# Patient Record
Sex: Male | Born: 1970 | Race: White | Hispanic: No | Marital: Married | Smoking: Former smoker
Health system: Southern US, Community
[De-identification: ages and names within clinical notes are randomized; demographics above are authoritative.]

## PROBLEM LIST (undated history)

## (undated) DIAGNOSIS — F32A Depression, unspecified: Secondary | ICD-10-CM

## (undated) DIAGNOSIS — F419 Anxiety disorder, unspecified: Secondary | ICD-10-CM

## (undated) DIAGNOSIS — F329 Major depressive disorder, single episode, unspecified: Secondary | ICD-10-CM

## (undated) DIAGNOSIS — K219 Gastro-esophageal reflux disease without esophagitis: Secondary | ICD-10-CM

## (undated) DIAGNOSIS — B019 Varicella without complication: Secondary | ICD-10-CM

## (undated) HISTORY — PX: NASAL POLYP EXCISION: SHX2068

## (undated) HISTORY — PX: VASECTOMY: SHX75

---

## 1999-05-24 ENCOUNTER — Encounter: Payer: Self-pay | Admitting: Emergency Medicine

## 1999-05-24 ENCOUNTER — Emergency Department (HOSPITAL_COMMUNITY): Admission: EM | Admit: 1999-05-24 | Discharge: 1999-05-24 | Payer: Self-pay | Admitting: Emergency Medicine

## 2006-12-13 ENCOUNTER — Emergency Department (HOSPITAL_COMMUNITY): Admission: EM | Admit: 2006-12-13 | Discharge: 2006-12-13 | Payer: Self-pay | Admitting: Emergency Medicine

## 2007-02-20 ENCOUNTER — Encounter: Admission: RE | Admit: 2007-02-20 | Discharge: 2007-02-20 | Payer: Self-pay | Admitting: Family Medicine

## 2007-02-22 ENCOUNTER — Encounter: Admission: RE | Admit: 2007-02-22 | Discharge: 2007-02-22 | Payer: Self-pay | Admitting: Family Medicine

## 2011-01-11 NOTE — Consult Note (Signed)
NAMEJAIYDEN, LAUR              ACCOUNT NO.:  [id number]   MEDICAL RECORD NO.:  [id number]          PATIENT TYPE:  EMS   LOCATION:  [facility]                         FACILITY:  [facility]   PHYSICIAN:  Artist Pais. Weingold, M.D.DATE OF BIRTH:  1971-06-30   DATE OF CONSULTATION:  12/13/2006  DATE OF DISCHARGE:  12/13/2006                                 CONSULTATION   EMERGENCY ROOM CONSULTATION:   PHYSICIAN REQUESTING CONSULTATION:  Is Dr. Weldon Inches.   REASON FOR CONSULTATION:  Mr. Byrnes is a 40 year old right hand  dominant male who was bit by his own cat on left hand over the index  finger.  Presents today with pain and swelling status post cat bit 12  hours ago.  He is 89 years old.   ALLERGIES:  HE HAS NO KNOWN DRUG ALLERGIES.   MEDICATIONS:  No current medications.   HOSPITALIZATIONS OR SURGERIES:  None.   FAMILY HISTORY:  Noncontributory.   SOCIAL HISTORY:  Occasional alcohol use.  He quit smoking a year ago.  He had been actively smoking for about 20 years.   PHYSICAL EXAMINATION:  GENERAL:  Pleasant, alerted and oriented x3.  EXTREMITIES:  He has a puncture wound on the dorsal aspect 2 cm proximal  to the MCP joint, one volarly just distal to the MCP joint.  He can flex  and extend.  He has negative Kanavel signs.  He has some slight swelling  dorsally and palmar over the puncture wounds.  He has no streak up the  arm, no adenopathy.  X-rays are pending.  He has not complained of  fever, chills, et Karie Soda.  Again, no signs of flexor sheath involvement  and his MCP joint is nontender to compression testing and he is able to  make a composite testing. Marland Kitchen   IMPRESSION:  A 40 year old male status post cat bit 12 hours ago with  pain and swelling.   RECOMMENDATIONS:  At this point in time is to receive an IV dose of  Unison 3 g.  He will be discharged with Augmentin 875 one p.o. b.i.d.  He is to followup in my office on Tuesday for a followup.  He is to soak  it in warm  soapy water three times a day and if he has any problems over  the weekend he is to call the office number for followup.     Artist Pais Mina Marble, M.D.  Electronically Signed    MAW/MEDQ  D:  12/13/2006  T:  12/13/2006  Job:  [phone]

## 2013-05-20 ENCOUNTER — Emergency Department (HOSPITAL_COMMUNITY): Payer: BC Managed Care – PPO

## 2013-05-20 ENCOUNTER — Encounter (HOSPITAL_COMMUNITY): Payer: Self-pay | Admitting: *Deleted

## 2013-05-20 ENCOUNTER — Emergency Department (HOSPITAL_COMMUNITY)
Admission: EM | Admit: 2013-05-20 | Discharge: 2013-05-21 | Disposition: A | Discharge status: Home / Self Care | Payer: BC Managed Care – PPO | Attending: Emergency Medicine | Admitting: Emergency Medicine

## 2013-05-20 ENCOUNTER — Other Ambulatory Visit (HOSPITAL_COMMUNITY): Payer: Self-pay | Admitting: Internal Medicine

## 2013-05-20 DIAGNOSIS — R110 Nausea: Secondary | ICD-10-CM

## 2013-05-20 DIAGNOSIS — R112 Nausea with vomiting, unspecified: Secondary | ICD-10-CM | POA: Insufficient documentation

## 2013-05-20 DIAGNOSIS — R109 Unspecified abdominal pain: Secondary | ICD-10-CM | POA: Diagnosis present

## 2013-05-20 LAB — CBC WITH DIFFERENTIAL/PLATELET
Basophils Absolute: 0 10*3/uL (ref 0.0–0.1)
Basophils Relative: 1 % (ref 0–1)
Eosinophils Absolute: 0.1 10*3/uL (ref 0.0–0.7)
HCT: 39.6 % (ref 39.0–52.0)
Hemoglobin: 14.7 g/dL (ref 13.0–17.0)
Lymphocytes Relative: 50 % — ABNORMAL HIGH (ref 12–46)
Monocytes Relative: 12 % (ref 3–12)
Neutro Abs: 1.4 10*3/uL — ABNORMAL LOW (ref 1.7–7.7)
Neutrophils Relative %: 34 % — ABNORMAL LOW (ref 43–77)
Platelets: 205 10*3/uL (ref 150–400)
RDW: 12.3 % (ref 11.5–15.5)
WBC: 4.1 10*3/uL (ref 4.0–10.5)

## 2013-05-20 LAB — COMPREHENSIVE METABOLIC PANEL
ALT: 18 U/L (ref 0–53)
AST: 17 U/L (ref 0–37)
Albumin: 4 g/dL (ref 3.5–5.2)
CO2: 28 mEq/L (ref 19–32)
Chloride: 102 mEq/L (ref 96–112)
Creatinine, Ser: 0.94 mg/dL (ref 0.50–1.35)
GFR calc non Af Amer: >90 mL/min (ref >90)
Glucose, Bld: 93 mg/dL (ref 70–99)
Potassium: 4.4 mEq/L (ref 3.5–5.1)
Sodium: 141 mEq/L (ref 135–145)
Total Bilirubin: 0.7 mg/dL (ref 0.3–1.2)

## 2013-05-20 LAB — URINALYSIS, ROUTINE W REFLEX MICROSCOPIC
Glucose, UA: NEGATIVE mg/dL
Hgb urine dipstick: NEGATIVE
Ketones, ur: NEGATIVE mg/dL
Leukocytes, UA: NEGATIVE
Protein, ur: NEGATIVE mg/dL
pH: 6 (ref 5.0–8.0)

## 2013-05-20 LAB — LIPASE, BLOOD: Lipase: 18 U/L (ref 11–59)

## 2013-05-20 MED ORDER — IOHEXOL 300 MG/ML  SOLN
25.0000 mL | Freq: Once | INTRAMUSCULAR | Status: AC | PRN
  Administered 2013-05-20: 25 mL via ORAL

## 2013-05-20 MED ORDER — ONDANSETRON HCL 4 MG/2ML IJ SOLN: 4.0000 mg | Freq: Once | INTRAMUSCULAR | Status: DC

## 2013-05-20 MED ORDER — IOHEXOL 300 MG/ML  SOLN
100.0000 mL | Freq: Once | INTRAMUSCULAR | Status: AC | PRN
  Administered 2013-05-20: 100 mL via INTRAVENOUS

## 2013-05-20 MED ORDER — METOCLOPRAMIDE HCL 5 MG/ML IJ SOLN
10.0000 mg | Freq: Once | INTRAMUSCULAR | Status: AC
  Administered 2013-05-20: 10 mg via INTRAVENOUS
  Filled 2013-05-20: qty 2

## 2013-05-20 MED ORDER — SODIUM CHLORIDE 0.9 % IV BOLUS (SEPSIS)
1000.0000 mL | INTRAVENOUS | Status: AC
  Administered 2013-05-20: 1000 mL via INTRAVENOUS

## 2013-05-20 NOTE — ED Provider Notes (Signed)
CSN: [id number]     Arrival date & time 05/20/13  1820 History   First MD Initiated Contact with Patient 05/20/13 1838     Chief Complaint  Patient presents with  . Abdominal Pain  . Nausea   (Consider location/radiation/quality/duration/timing/severity/associated sxs/prior Treatment) Patient is a 42 y.o. male presenting with abdominal pain. The history is provided by the patient.  Abdominal Pain Pain location:  Epigastric and RUQ Pain quality: sharp   Pain radiates to:  Does not radiate Pain severity:  Moderate Onset quality:  Gradual Duration:  9 days Timing:  Intermittent Progression:  Worsening Chronicity:  New Context: not previous surgeries   Relieved by:  Nothing Worsened by:  Nothing tried Ineffective treatments:  None tried Associated symptoms: nausea and vomiting   Associated symptoms: no chest pain, no cough, no diarrhea, no dysuria, no fever, no hematuria and no shortness of breath     History reviewed. No pertinent past medical history. History reviewed. No pertinent past surgical history. History reviewed. No pertinent family history. History  Substance Use Topics  . Smoking status: Never Smoker   . Smokeless tobacco: Not on file  . Alcohol Use: Yes     Comment: occ    Review of Systems  Constitutional: Negative for fever.  HENT: Negative for rhinorrhea, drooling and neck pain.   Eyes: Negative for pain.  Respiratory: Negative for cough and shortness of breath.   Cardiovascular: Negative for chest pain and leg swelling.  Gastrointestinal: Positive for nausea, vomiting and abdominal pain. Negative for diarrhea.  Genitourinary: Negative for dysuria and hematuria.  Musculoskeletal: Negative for gait problem.  Skin: Negative for color change.  Neurological: Negative for numbness and headaches.  Hematological: Negative for adenopathy.  Psychiatric/Behavioral: Negative for behavioral problems.  All other systems reviewed and are negative.    Allergies   Review of patient's allergies indicates no known allergies.  Home Medications  No current outpatient prescriptions on file. BP 137/86  Pulse 61  Temp(Src) 97.8 F (36.6 C) (Oral)  Resp 18  Ht 5\' 10"  (1.778 m)  Wt 165 lb (74.844 kg)  BMI 23.68 kg/m2  SpO2 99% Physical Exam  Nursing note and vitals reviewed. Constitutional: He is oriented to person, place, and time. He appears well-developed and well-nourished.  HENT:  Head: Normocephalic and atraumatic.  Right Ear: External ear normal.  Left Ear: External ear normal.  Nose: Nose normal.  Mouth/Throat: Oropharynx is clear and moist. No oropharyngeal exudate.  Eyes: Conjunctivae and EOM are normal. Pupils are equal, round, and reactive to light.  Neck: Normal range of motion. Neck supple.  Cardiovascular: Normal rate, regular rhythm, normal heart sounds and intact distal pulses.  Exam reveals no gallop and no friction rub.   No murmur heard. Pulmonary/Chest: Effort normal and breath sounds normal. No respiratory distress. He has no wheezes.  Abdominal: Soft. Bowel sounds are normal. He exhibits no distension. There is no tenderness. There is no rebound and no guarding.  Musculoskeletal: Normal range of motion. He exhibits no edema and no tenderness.  Neurological: He is alert and oriented to person, place, and time.  Skin: Skin is warm and dry.  Psychiatric: He has a normal mood and affect. His behavior is normal.    ED Course  Procedures (including critical care time) Labs Review Labs Reviewed  CBC WITH DIFFERENTIAL - Abnormal; Notable for the following:    MCH 34.3 (*)    MCHC 37.3 (*)    Neutrophils Relative % 34 (*)  Neutro Abs 1.4 (*)    Lymphocytes Relative 50 (*)    All other components within normal limits  COMPREHENSIVE METABOLIC PANEL  LIPASE, BLOOD  URINALYSIS, ROUTINE W REFLEX MICROSCOPIC   Imaging Review Ct Abdomen Pelvis W Contrast  05/21/2013   CLINICAL DATA:  Abdominal pain, nausea and diarrhea.   EXAM: CT ABDOMEN AND PELVIS WITH CONTRAST  TECHNIQUE: Multidetector CT imaging of the abdomen and pelvis was performed using the standard protocol following bolus administration of intravenous contrast.  CONTRAST:  OMNIPAQUE IOHEXOL 300 MG/ML  SOLN  COMPARISON:  None.  FINDINGS: There is some dependent atelectasis in the lung bases. No pleural or pericardial effusion.  The gallbladder, liver, spleen, adrenal glands, left kidney and pancreas all appear normal. Tiny low attenuating lesion in the lower pole of the right kidney is compatible with a cyst. The right kidney is otherwise unremarkable.  The stomach, small and large bowel and appendix appear normal. No fluid is identified. The urinary bladder is decompressed but otherwise unremarkable. No lymphadenopathy. There is some scattered atherosclerosis of the aorta.  IMPRESSION: No acute finding. No finding to explain the patient's symptoms. Mild atherosclerosis noted.   Electronically Signed   By: Drusilla Kanner M.D.   On: 05/21/2013 00:39   US Abdomen Limited Ruq  05/20/2013   *RADIOLOGY REPORT*  Clinical Data:  Right upper quadrant abdominal pain.  ABDOMINAL ULTRASOUND COMPLETE  Comparison:  None.  Findings:  Gallbladder:  No gallstones, gallbladder wall thickening, or pericholecystic fluid.  Common Bile Duct:  Measures 3.6 mm which is within normal limits in caliber.  Liver: No focal mass lesion identified.  Within normal limits in parenchymal echogenicity.  IVC:  Appears normal.  Pancreas:  No abnormality identified.  Spleen:  Within normal limits in size and echotexture.  Right kidney:  Measures 12.6 cm in length. Normal in size and parenchymal echogenicity.  No evidence of mass or hydronephrosis.  Left kidney:  Measures 11.9 cm in length. Normal in size and parenchymal echogenicity.  No evidence of mass or hydronephrosis.  Abdominal Aorta:  No aneurysm identified.  IMPRESSION: Negative abdominal ultrasound.   Original Report Authenticated By: Lupita Raider.,  M.D.    MDM   1. Abdominal pain   2. Nausea    7:00 PM 42 y.o. male who presents with nausea and upper abdominal pain for approximately 9 days. The patient denies any fevers or diarrhea. He notes that his pain began approximately 30 minutes after eating. Usually does not have pain unless he eats. He is afebrile and vital signs are unremarkable here. He is not having any pain on exam. Will get nausea control, IV fluids, labs, ultrasound to evaluate his gallbladder.  12:55 AM: I interpreted/reviewed the labs and/or imaging which were non-contributory.  Neg CT and [place] of gb. Will start on sx tx, pain meds, ppi, nausea meds.  Unsure of the etiology of his pain but it appears to be related to eating. Possibly gi/duod ulcer vs gb dysfunction.   I have discussed the diagnosis/risks/treatment options with the patient and family and believe the pt to be eligible for discharge home to follow-up with his pcp tomorrow. We also discussed returning to the ED immediately if new or worsening sx occur. We discussed the sx which are most concerning (e.g., fever, worsening pain) that necessitate immediate return. Any new prescriptions provided to the patient are listed below.  Discharge Medication List as of 05/21/2013 12:57 AM    START taking these medications  Details  oxyCODONE-acetaminophen (PERCOCET) 5-325 MG per tablet Take 1 tablet by mouth every 6 (six) hours as needed for pain., Starting 05/21/2013, Until Discontinued, Print    pantoprazole (PROTONIX) 20 MG tablet Take 1 tablet (20 mg total) by mouth daily., Starting 05/21/2013, Until Discontinued, Print    promethazine (PHENERGAN) 25 MG tablet Take 1 tablet (25 mg total) by mouth every 8 (eight) hours as needed for nausea., Starting 05/21/2013, Until Discontinued, Print         Junius Argyle, MD 05/21/13 1455

## 2013-05-20 NOTE — ED Notes (Signed)
Pt reports generalized abd pain with nausea and diarrhea. Has been to pcp and given zofran with no relief.

## 2013-05-21 ENCOUNTER — Ambulatory Visit (HOSPITAL_COMMUNITY): Payer: BC Managed Care – PPO

## 2013-05-21 ENCOUNTER — Encounter (HOSPITAL_COMMUNITY): Payer: Self-pay | Admitting: Radiology

## 2013-05-21 DIAGNOSIS — R110 Nausea: Secondary | ICD-10-CM | POA: Diagnosis present

## 2013-05-21 DIAGNOSIS — R109 Unspecified abdominal pain: Secondary | ICD-10-CM | POA: Diagnosis present

## 2013-05-21 MED ORDER — OXYCODONE-ACETAMINOPHEN 5-325 MG PO TABS: 1.0000 | ORAL_TABLET | Freq: Four times a day (QID) | ORAL | Status: DC | PRN

## 2013-05-21 MED ORDER — PANTOPRAZOLE SODIUM 20 MG PO TBEC: 20.0000 mg | DELAYED_RELEASE_TABLET | Freq: Every day | ORAL | Status: DC

## 2013-05-21 MED ORDER — PROMETHAZINE HCL 25 MG PO TABS: 25.0000 mg | ORAL_TABLET | Freq: Three times a day (TID) | ORAL | Status: DC | PRN

## 2015-11-16 ENCOUNTER — Telehealth: Payer: Self-pay | Admitting: Internal Medicine

## 2015-11-16 NOTE — Telephone Encounter (Signed)
Received records from [facility] for appointment on 11/30/15 with Dr Hilty.  Records given to N Hines (medical records) for Dr Hilty's schedule on 11/30/15. lp °

## 2015-11-30 ENCOUNTER — Ambulatory Visit: Payer: Self-pay | Admitting: Internal Medicine

## 2016-03-05 ENCOUNTER — Other Ambulatory Visit: Payer: Self-pay | Admitting: Gastroenterology

## 2016-03-05 DIAGNOSIS — R1011 Right upper quadrant pain: Secondary | ICD-10-CM

## 2016-03-20 ENCOUNTER — Ambulatory Visit (HOSPITAL_COMMUNITY)
Admission: RE | Admit: 2016-03-20 | Discharge: 2016-03-20 | Disposition: A | Discharge status: Home / Self Care | Payer: BLUE CROSS/BLUE SHIELD | Source: Ambulatory Visit | Attending: Gastroenterology | Admitting: Gastroenterology

## 2016-03-20 DIAGNOSIS — R1011 Right upper quadrant pain: Secondary | ICD-10-CM | POA: Insufficient documentation

## 2016-03-20 MED ORDER — TECHNETIUM TC 99M MEBROFENIN IV KIT
5.5000 | PACK | Freq: Once | INTRAVENOUS | Status: AC | PRN
  Administered 2016-03-20: 5.5 via INTRAVENOUS

## 2016-04-04 ENCOUNTER — Other Ambulatory Visit: Payer: Self-pay | Admitting: Gastroenterology

## 2016-04-04 DIAGNOSIS — R110 Nausea: Secondary | ICD-10-CM

## 2016-04-04 DIAGNOSIS — R1084 Generalized abdominal pain: Secondary | ICD-10-CM

## 2016-04-10 ENCOUNTER — Encounter (HOSPITAL_COMMUNITY)
Admission: RE | Admit: 2016-04-10 | Discharge: 2016-04-10 | Disposition: A | Discharge status: Home / Self Care | Payer: BLUE CROSS/BLUE SHIELD | Source: Ambulatory Visit | Attending: Gastroenterology | Admitting: Gastroenterology

## 2016-04-10 DIAGNOSIS — R110 Nausea: Secondary | ICD-10-CM | POA: Insufficient documentation

## 2016-04-10 MED ORDER — TECHNETIUM TC 99M SULFUR COLLOID
2.0000 | Freq: Once | INTRAVENOUS | Status: AC | PRN
  Administered 2016-04-10: 2 via ORAL

## 2016-06-24 ENCOUNTER — Other Ambulatory Visit: Payer: Self-pay | Admitting: Surgery

## 2016-07-10 ENCOUNTER — Encounter (HOSPITAL_COMMUNITY): Payer: Self-pay | Admitting: Anesthesiology

## 2016-07-10 ENCOUNTER — Encounter (HOSPITAL_COMMUNITY): Payer: Self-pay

## 2016-07-10 ENCOUNTER — Encounter (HOSPITAL_COMMUNITY)
Admission: RE | Admit: 2016-07-10 | Discharge: 2016-07-10 | Disposition: A | Discharge status: Home / Self Care | Payer: Managed Care, Other (non HMO) | Source: Ambulatory Visit | Attending: Surgery | Admitting: Surgery

## 2016-07-10 DIAGNOSIS — Z79899 Other long term (current) drug therapy: Secondary | ICD-10-CM | POA: Diagnosis not present

## 2016-07-10 DIAGNOSIS — Z87891 Personal history of nicotine dependence: Secondary | ICD-10-CM | POA: Diagnosis not present

## 2016-07-10 DIAGNOSIS — K219 Gastro-esophageal reflux disease without esophagitis: Secondary | ICD-10-CM | POA: Diagnosis not present

## 2016-07-10 DIAGNOSIS — F419 Anxiety disorder, unspecified: Secondary | ICD-10-CM | POA: Diagnosis not present

## 2016-07-10 DIAGNOSIS — Z7951 Long term (current) use of inhaled steroids: Secondary | ICD-10-CM | POA: Diagnosis not present

## 2016-07-10 DIAGNOSIS — K811 Chronic cholecystitis: Secondary | ICD-10-CM | POA: Diagnosis not present

## 2016-07-10 HISTORY — DX: Gastro-esophageal reflux disease without esophagitis: K21.9

## 2016-07-10 HISTORY — DX: Major depressive disorder, single episode, unspecified: F32.9

## 2016-07-10 HISTORY — DX: Depression, unspecified: F32.A

## 2016-07-10 HISTORY — DX: Anxiety disorder, unspecified: F41.9

## 2016-07-10 LAB — CBC
HCT: 41.7 % (ref 39.0–52.0)
HEMOGLOBIN: 15 g/dL (ref 13.0–17.0)
MCH: 33.6 pg (ref 26.0–34.0)
MCHC: 36 g/dL (ref 30.0–36.0)
MCV: 93.3 fL (ref 78.0–100.0)
PLATELETS: 213 10*3/uL (ref 150–400)
RBC: 4.47 MIL/uL (ref 4.22–5.81)
RDW: 12.6 % (ref 11.5–15.5)
WBC: 7.3 10*3/uL (ref 4.0–10.5)

## 2016-07-10 LAB — BASIC METABOLIC PANEL
ANION GAP: 7 (ref 5–15)
BUN: 9 mg/dL (ref 6–20)
CALCIUM: 9.8 mg/dL (ref 8.9–10.3)
CO2: 29 mmol/L (ref 22–32)
CREATININE: 0.9 mg/dL (ref 0.61–1.24)
Chloride: 106 mmol/L (ref 101–111)
Glucose, Bld: 84 mg/dL (ref 65–99)
Potassium: 4.3 mmol/L (ref 3.5–5.1)
Sodium: 142 mmol/L (ref 135–145)

## 2016-07-10 NOTE — Anesthesia Preprocedure Evaluation (Addendum)
Anesthesia Evaluation  Patient identified by MRN, date of birth, ID band Patient awake    Reviewed: Allergy & Precautions, NPO status , Patient's Chart, lab work & pertinent test results  Airway Mallampati: II       Dental no notable dental hx. (+) Teeth Intact   Pulmonary former smoker,    Pulmonary exam normal breath sounds clear to auscultation       Cardiovascular negative cardio ROS Normal cardiovascular exam Rhythm:Regular Rate:Normal     Neuro/Psych PSYCHIATRIC DISORDERS Anxiety Depression negative neurological ROS     GI/Hepatic Neg liver ROS, GERD  Medicated and Controlled,Biliary dyskinesia   Endo/Other  negative endocrine ROS  Renal/GU negative Renal ROS  negative genitourinary   Musculoskeletal negative musculoskeletal ROS (+)   Abdominal Normal abdominal exam  (+)   Peds  Hematology negative hematology ROS (+)   Anesthesia Other Findings   Reproductive/Obstetrics                            Lab Results  Component Value Date   WBC 7.3 07/10/2016   HGB 15.0 07/10/2016   HCT 41.7 07/10/2016   MCV 93.3 07/10/2016   PLT 213 07/10/2016     Chemistry      Component Value Date/Time   NA 142 07/10/2016 1428   K 4.3 07/10/2016 1428   CL 106 07/10/2016 1428   CO2 29 07/10/2016 1428   BUN 9 07/10/2016 1428   CREATININE 0.90 07/10/2016 1428      Component Value Date/Time   CALCIUM 9.8 07/10/2016 1428   ALKPHOS 55 05/20/2013 1832   AST 17 05/20/2013 1832   ALT 18 05/20/2013 1832   BILITOT 0.7 05/20/2013 1832      Anesthesia Physical Anesthesia Plan  ASA: II  Anesthesia Plan: General   Post-op Pain Management:    Induction: Intravenous, Rapid sequence and Cricoid pressure planned  Airway Management Planned: Oral ETT  Additional Equipment:   Intra-op Plan:   Post-operative Plan: Extubation in OR  Informed Consent: I have reviewed the patients History and  Physical, chart, labs and discussed the procedure including the risks, benefits and alternatives for the proposed anesthesia with the patient or authorized representative who has indicated his/her understanding and acceptance.   Dental advisory given  Plan Discussed with: CRNA, Anesthesiologist and Surgeon  Anesthesia Plan Comments:         Anesthesia Quick Evaluation

## 2016-07-10 NOTE — H&P (Signed)
Berna SpareMarcus B. Standish 06/24/2016 2:13 PM Location: [facility] Patient #: [id number] DOB: [birth year] Married / Language: English / Race: White Male   History of Present Illness (Dorian Duval A. Magnus IvanBlackman MD; 06/24/2016 2:47 PM) The patient is a 45 year old male who presents with abdominal pain. This is a pleasant gentleman referred by Dr. Charna ElizabethJyothi Mann for a history of right upper quadrant and epigastric abdominal pain, nausea, and vomiting. This is been occurring for many years. He has also had diarrhea with this. The pain is moderate to severe. He has had to go to the emergency department secondary to discomfort. There is been no hematemesis or jaundice. He has had an extensive workup including upper and lower endoscopy, gastric emptying study, CAT scan, ultrasound, and HIDA scan. All have been normal except for the HIDA scan showing a 38% gallbladder ejection fraction with symptoms reproduced with CCK administration. The attacks are now becoming more frequent and are causing some problems with work. He is otherwise completely healthy.   Other Problems Juanita Craver(Armen Glenn, CMA; 06/24/2016 2:13 PM) Anxiety Disorder Chest pain Depression Diverticulosis Gastroesophageal Reflux Disease Hemorrhoids High blood pressure  Past Surgical History Juanita Craver(Armen Glenn, CMA; 06/24/2016 2:13 PM) Colon Polyp Removal - Colonoscopy Oral Surgery Vasectomy  Diagnostic Studies History Juanita Craver(Armen Glenn, CMA; 06/24/2016 2:13 PM) Colonoscopy within last year  Allergies Juanita Craver(Armen Glenn, CMA; 06/24/2016 2:15 PM) No Known Drug Allergies10/30/2017  Medication History Juanita Craver(Armen Glenn, CMA; 06/24/2016 2:15 PM) DiazePAM (5MG  Tablet, Oral) Active. Ondansetron HCl (8MG  Tablet, Oral) Active. Medications Reconciled  Social History Juanita Craver(Armen Glenn, CMA; 06/24/2016 2:13 PM) Alcohol use Moderate alcohol use. Caffeine use Coffee. Illicit drug use Remotely quit drug use. Tobacco use Former smoker.  Family  History Juanita Craver(Armen Glenn, CMA; 06/24/2016 2:13 PM) Cancer Father. Colon Cancer Father. Diabetes Mellitus Mother. Prostate Cancer Father.    Review of Systems (Armen Sherrine MaplesGlenn CMA; 06/24/2016 2:13 PM) General Present- Weight Loss. Not Present- Appetite Loss, Chills, Fatigue, Fever, Night Sweats and Weight Gain. HEENT Present- Seasonal Allergies and Wears glasses/contact lenses. Not Present- Earache, Hearing Loss, Hoarseness, Nose Bleed, Oral Ulcers, Ringing in the Ears, Sinus Pain, Sore Throat, Visual Disturbances and Yellow Eyes. Respiratory Present- Snoring. Not Present- Bloody sputum, Chronic Cough, Difficulty Breathing and Wheezing. Breast Not Present- Breast Mass, Breast Pain, Nipple Discharge and Skin Changes. Cardiovascular Not Present- Chest Pain, Difficulty Breathing Lying Down, Leg Cramps, Palpitations, Rapid Heart Rate, Shortness of Breath and Swelling of Extremities. Gastrointestinal Present- Abdominal Pain, Bloating, Bloody Stool, Change in Bowel Habits, Chronic diarrhea, Hemorrhoids, Indigestion, Nausea and Vomiting. Not Present- Constipation, Difficulty Swallowing, Excessive gas, Gets full quickly at meals and Rectal Pain. Male Genitourinary Not Present- Blood in Urine, Change in Urinary Stream, Frequency, Impotence, Nocturia, Painful Urination, Urgency and Urine Leakage. Musculoskeletal Present- Back Pain and Joint Stiffness. Not Present- Joint Pain, Muscle Pain, Muscle Weakness and Swelling of Extremities. Neurological Not Present- Decreased Memory, Fainting, Headaches, Numbness, Seizures, Tingling, Tremor, Trouble walking and Weakness. Endocrine Not Present- Cold Intolerance, Excessive Hunger, Hair Changes, Heat Intolerance, Hot flashes and New Diabetes. Hematology Not Present- Blood Thinners, Easy Bruising, Excessive bleeding, Gland problems, HIV and Persistent Infections.  Vitals (Armen Glenn CMA; 06/24/2016 2:14 PM) 06/24/2016 2:14 PM Weight: 163.38 lb Height: 70in Body  Surface Area: 1.92 m Body Mass Index: 23.44 kg/m  Temp.: 97.46F  Pulse: 95 (Regular)  P.OX: 99% (Room air) BP: 118/69 (Sitting, Left Arm, Standard)       Physical Exam (Myrlene Riera A. Magnus IvanBlackman MD; 06/24/2016 2:47 PM) General Mental Status-Alert. General Appearance-Consistent with  stated age. Hydration-Well hydrated. Voice-Normal.  Head and Neck Head-normocephalic, atraumatic with no lesions or palpable masses.  Eye Eyeball - Bilateral-Extraocular movements intact. Sclera/Conjunctiva - Bilateral-No scleral icterus.  Chest and Lung Exam Chest and lung exam reveals -quiet, even and easy respiratory effort with no use of accessory muscles and on auscultation, normal breath sounds, no adventitious sounds and normal vocal resonance. Inspection Chest Wall - Normal. Back - normal.  Cardiovascular Cardiovascular examination reveals -on palpation PMI is normal in location and amplitude, no palpable S3 or S4. Normal cardiac borders., normal heart sounds, regular rate and rhythm with no murmurs, carotid auscultation reveals no bruits and normal pedal pulses bilaterally.  Abdomen Inspection Inspection of the abdomen reveals - No Hernias. Skin - Scar - no surgical scars. Palpation/Percussion Palpation and Percussion of the abdomen reveal - Soft, No Rebound tenderness, No Rigidity (guarding) and No hepatosplenomegaly. Tenderness - Right Upper Quadrant. Auscultation Auscultation of the abdomen reveals - Bowel sounds normal.  Neurologic Neurologic evaluation reveals -alert and oriented x 3 with no impairment of recent or remote memory. Mental Status-Normal.  Musculoskeletal Normal Exam - Left-Upper Extremity Strength Normal and Lower Extremity Strength Normal. Normal Exam - Right-Upper Extremity Strength Normal, Lower Extremity Weakness.    Assessment & Plan (Kayal Mula A. Magnus IvanBlackman MD; 06/24/2016 2:49 PM) BILIARY DYSKINESIA (K82.8) Impression: After  examining him and reviewing all his studies, I believe he has biliary dyskinesia and chronic cholecystitis. I discussed this with him in detail and gave him literature regarding cholecystectomy. We also discussed continued expectant management. I discussed the surgical procedure with him in detail. I discussed the risk of surgery. These risks include but are not limited to bleeding, infection, bile duct injury, bile leak, injury to other structures, the need to convert to an open procedure, cardiopulmonary issues, the chance this may not resolve any of his symptoms, postoperative recovery, etc. After long discussion, he wished to proceed with laparoscopic cholecystectomy

## 2016-07-10 NOTE — Pre-Procedure Instructions (Signed)
Andrew [name]  07/10/2016      [id number] Andrew [name], [place] - [address] [name] [name] [id number] Phone: [phone] Fax: [phone]    Your procedure is scheduled on November 16  [facility].M.  Call this number if you have problems the morning of surgery:  [phone]   Remember:  Do not eat food or drink liquids after midnight.   Take these medicines the morning of surgery with A SIP OF WATER fluticasone (FLONASE), ondansetron (ZOFRAN)  If needed  7 days prior to surgery STOP taking any Aspirin, Aleve, Naproxen, Ibuprofen, Motrin, Advil, Goody's, BC's, all herbal medications, fish oil, and all vitamins    Do not wear jewelry  Do not wear lotions, powders, or cologne, or deoderant.  Men may shave face and neck.  Do not bring valuables to the hospital.  [facility] is not responsible for any belongings or valuables.  Contacts, dentures or bridgework may not be worn into surgery.  Leave your suitcase in the car.  After surgery it may be brought to your room.  For patients admitted to the hospital, discharge time will be determined by your treatment team.  Patients discharged the day of surgery will not be allowed to drive home.    Special instructions:   [place]- Preparing For Surgery  Before surgery, you can play an important role. Because skin is not sterile, your skin needs to be as free of germs as possible. You can reduce the number of germs on your skin by washing with CHG (chlorahexidine gluconate) Soap before surgery.  CHG is an antiseptic cleaner which kills germs and bonds with the skin to continue killing germs even after washing.  Please do not use if you have an allergy to CHG or antibacterial soaps. If your skin becomes reddened/irritated stop using the CHG.  Do not shave (including legs and underarms) for at least 48 hours prior to first CHG shower. It is OK to shave your  face.  Please follow these instructions carefully.   1. Shower the NIGHT BEFORE SURGERY and the MORNING OF SURGERY with CHG.   2. If you chose to wash your hair, wash your hair first as usual with your normal shampoo.  3. After you shampoo, rinse your hair and body thoroughly to remove the shampoo.  4. Use CHG as you would any other liquid soap. You can apply CHG directly to the skin and wash gently with a scrungie or a clean washcloth.   5. Apply the CHG Soap to your body ONLY FROM THE NECK DOWN.  Do not use on open wounds or open sores. Avoid contact with your eyes, ears, mouth and genitals (private parts). Wash genitals (private parts) with your normal soap.  6. Wash thoroughly, paying special attention to the area where your surgery will be performed.  7. Thoroughly rinse your body with warm water from the neck down.  8. DO NOT shower/wash with your normal soap after using and rinsing off the CHG Soap.  9. Pat yourself dry with a CLEAN TOWEL.   10. Wear CLEAN PAJAMAS   11. Place CLEAN SHEETS on your bed the night of your first shower and DO NOT SLEEP WITH PETS.    Day of Surgery: Do not apply any deodorants/lotions. Please wear clean clothes to the hospital/surgery center.      Please read over the following fact sheets that you were given. Pain  Booklet, Coughing and Deep Breathing and Surgical Site Infection Prevention

## 2016-07-10 NOTE — Progress Notes (Signed)
PCP - [facility] Cardiologist - denies  Chest x-ray - not needed EKG - may 2017 - normal but requesting from PCP Stress Test - denies ECHO - denies Cardiac Cath -denies     Patient denies shortness of breath, fever, cough and chest pain at PAT appointment

## 2016-07-11 ENCOUNTER — Ambulatory Visit (HOSPITAL_COMMUNITY): Payer: Managed Care, Other (non HMO) | Admitting: Anesthesiology

## 2016-07-11 ENCOUNTER — Encounter (HOSPITAL_COMMUNITY)
Admission: RE | Disposition: A | Discharge status: Home / Self Care | Payer: Self-pay | Source: Ambulatory Visit | Attending: Surgery

## 2016-07-11 ENCOUNTER — Ambulatory Visit (HOSPITAL_COMMUNITY)
Admission: RE | Admit: 2016-07-11 | Discharge: 2016-07-11 | Disposition: A | Discharge status: Home / Self Care | Payer: Managed Care, Other (non HMO) | Source: Ambulatory Visit | Attending: Surgery | Admitting: Surgery

## 2016-07-11 ENCOUNTER — Encounter (HOSPITAL_COMMUNITY): Payer: Self-pay | Admitting: *Deleted

## 2016-07-11 DIAGNOSIS — F419 Anxiety disorder, unspecified: Secondary | ICD-10-CM | POA: Insufficient documentation

## 2016-07-11 DIAGNOSIS — K811 Chronic cholecystitis: Secondary | ICD-10-CM | POA: Insufficient documentation

## 2016-07-11 DIAGNOSIS — Z87891 Personal history of nicotine dependence: Secondary | ICD-10-CM | POA: Insufficient documentation

## 2016-07-11 DIAGNOSIS — Z79899 Other long term (current) drug therapy: Secondary | ICD-10-CM | POA: Insufficient documentation

## 2016-07-11 DIAGNOSIS — K219 Gastro-esophageal reflux disease without esophagitis: Secondary | ICD-10-CM | POA: Insufficient documentation

## 2016-07-11 DIAGNOSIS — Z7951 Long term (current) use of inhaled steroids: Secondary | ICD-10-CM | POA: Insufficient documentation

## 2016-07-11 HISTORY — PX: CHOLECYSTECTOMY: SHX55

## 2016-07-11 SURGERY — LAPAROSCOPIC CHOLECYSTECTOMY
Anesthesia: General | Site: Abdomen

## 2016-07-11 MED ORDER — SODIUM CHLORIDE 0.9 % IR SOLN
Status: DC | PRN
  Administered 2016-07-11: 1

## 2016-07-11 MED ORDER — ROCURONIUM BROMIDE 10 MG/ML (PF) SYRINGE
PREFILLED_SYRINGE | INTRAVENOUS | Status: AC
  Filled 2016-07-11: qty 10

## 2016-07-11 MED ORDER — ROCURONIUM BROMIDE 100 MG/10ML IV SOLN
INTRAVENOUS | Status: DC | PRN
  Administered 2016-07-11: 50 mg via INTRAVENOUS

## 2016-07-11 MED ORDER — LIDOCAINE HCL (CARDIAC) 20 MG/ML IV SOLN
INTRAVENOUS | Status: DC | PRN
  Administered 2016-07-11: 70 mg via INTRAVENOUS

## 2016-07-11 MED ORDER — HYDROMORPHONE HCL 1 MG/ML IJ SOLN
0.2500 mg | INTRAMUSCULAR | Status: DC | PRN
  Administered 2016-07-11 (×3): 0.5 mg via INTRAVENOUS

## 2016-07-11 MED ORDER — KETOROLAC TROMETHAMINE 30 MG/ML IJ SOLN
INTRAMUSCULAR | Status: AC
  Filled 2016-07-11: qty 1

## 2016-07-11 MED ORDER — BUPIVACAINE HCL (PF) 0.25 % IJ SOLN
INTRAMUSCULAR | Status: AC
  Filled 2016-07-11: qty 30

## 2016-07-11 MED ORDER — CHLORHEXIDINE GLUCONATE CLOTH 2 % EX PADS: 6.0000 | MEDICATED_PAD | Freq: Once | CUTANEOUS | Status: DC

## 2016-07-11 MED ORDER — KETOROLAC TROMETHAMINE 30 MG/ML IJ SOLN
INTRAMUSCULAR | Status: DC | PRN
  Administered 2016-07-11: 30 mg via INTRAVENOUS

## 2016-07-11 MED ORDER — LIDOCAINE 2% (20 MG/ML) 5 ML SYRINGE
INTRAMUSCULAR | Status: AC
  Filled 2016-07-11: qty 5

## 2016-07-11 MED ORDER — OXYCODONE HCL 5 MG PO TABS
ORAL_TABLET | ORAL | Status: AC
  Administered 2016-07-11: 5 mg
  Filled 2016-07-11: qty 1

## 2016-07-11 MED ORDER — ONDANSETRON HCL 4 MG/2ML IJ SOLN
INTRAMUSCULAR | Status: AC
  Filled 2016-07-11: qty 2

## 2016-07-11 MED ORDER — PROPOFOL 10 MG/ML IV BOLUS
INTRAVENOUS | Status: AC
  Filled 2016-07-11: qty 20

## 2016-07-11 MED ORDER — ONDANSETRON HCL 4 MG/2ML IJ SOLN
INTRAMUSCULAR | Status: DC | PRN
Start: 2016-07-11 — End: 2016-07-11
  Administered 2016-07-11: 4 mg via INTRAVENOUS

## 2016-07-11 MED ORDER — DEXAMETHASONE SODIUM PHOSPHATE 10 MG/ML IJ SOLN
INTRAMUSCULAR | Status: DC | PRN
  Administered 2016-07-11: 10 mg via INTRAVENOUS

## 2016-07-11 MED ORDER — HYDROMORPHONE HCL 1 MG/ML IJ SOLN
INTRAMUSCULAR | Status: AC
  Filled 2016-07-11: qty 1

## 2016-07-11 MED ORDER — SUGAMMADEX SODIUM 500 MG/5ML IV SOLN
INTRAVENOUS | Status: DC | PRN
  Administered 2016-07-11: 300 mg via INTRAVENOUS

## 2016-07-11 MED ORDER — MIDAZOLAM HCL 2 MG/2ML IJ SOLN
INTRAMUSCULAR | Status: AC
  Filled 2016-07-11: qty 2

## 2016-07-11 MED ORDER — 0.9 % SODIUM CHLORIDE (POUR BTL) OPTIME
TOPICAL | Status: DC | PRN
  Administered 2016-07-11: 1000 mL

## 2016-07-11 MED ORDER — BUPIVACAINE-EPINEPHRINE 0.25% -1:200000 IJ SOLN
INTRAMUSCULAR | Status: DC | PRN
  Administered 2016-07-11: 20 mL

## 2016-07-11 MED ORDER — DEXAMETHASONE SODIUM PHOSPHATE 10 MG/ML IJ SOLN
INTRAMUSCULAR | Status: AC
  Filled 2016-07-11: qty 1

## 2016-07-11 MED ORDER — FENTANYL CITRATE (PF) 100 MCG/2ML IJ SOLN
INTRAMUSCULAR | Status: AC
  Filled 2016-07-11: qty 2

## 2016-07-11 MED ORDER — CEFAZOLIN SODIUM-DEXTROSE 2-4 GM/100ML-% IV SOLN
2.0000 g | INTRAVENOUS | Status: AC
  Administered 2016-07-11: 2 g via INTRAVENOUS

## 2016-07-11 MED ORDER — FENTANYL CITRATE (PF) 100 MCG/2ML IJ SOLN
INTRAMUSCULAR | Status: DC | PRN
  Administered 2016-07-11: 100 ug via INTRAVENOUS
  Administered 2016-07-11 (×4): 50 ug via INTRAVENOUS

## 2016-07-11 MED ORDER — HYDROCODONE-ACETAMINOPHEN 7.5-325 MG PO TABS: 1.0000 | ORAL_TABLET | Freq: Once | ORAL | Status: DC | PRN

## 2016-07-11 MED ORDER — CEFAZOLIN SODIUM-DEXTROSE 2-4 GM/100ML-% IV SOLN
INTRAVENOUS | Status: AC
  Filled 2016-07-11: qty 100

## 2016-07-11 MED ORDER — LACTATED RINGERS IV SOLN
INTRAVENOUS | Status: DC | PRN
  Administered 2016-07-11: 07:00:00 via INTRAVENOUS

## 2016-07-11 MED ORDER — METOCLOPRAMIDE HCL 5 MG/ML IJ SOLN: 10.0000 mg | Freq: Once | INTRAMUSCULAR | Status: DC | PRN

## 2016-07-11 MED ORDER — MEPERIDINE HCL 25 MG/ML IJ SOLN: 6.2500 mg | INTRAMUSCULAR | Status: DC | PRN

## 2016-07-11 MED ORDER — PROPOFOL 10 MG/ML IV BOLUS
INTRAVENOUS | Status: DC | PRN
  Administered 2016-07-11: 150 mg via INTRAVENOUS

## 2016-07-11 MED ORDER — MIDAZOLAM HCL 5 MG/5ML IJ SOLN
INTRAMUSCULAR | Status: DC | PRN
  Administered 2016-07-11: 2 mg via INTRAVENOUS

## 2016-07-11 MED ORDER — OXYCODONE-ACETAMINOPHEN 5-325 MG PO TABS: 1.0000 | ORAL_TABLET | ORAL | 0 refills | Status: DC | PRN

## 2016-07-11 MED ORDER — FENTANYL CITRATE (PF) 100 MCG/2ML IJ SOLN
INTRAMUSCULAR | Status: AC
  Filled 2016-07-11: qty 4

## 2016-07-11 SURGICAL SUPPLY — 40 items
ADH SKN CLS APL DERMABOND .7 (GAUZE/BANDAGES/DRESSINGS) ×1
APPLIER CLIP 5 13 M/L LIGAMAX5 (MISCELLANEOUS) ×2
APR CLP MED LRG 5 ANG JAW (MISCELLANEOUS) ×1
BAG SPEC RTRVL LRG 6X4 10 (ENDOMECHANICALS) ×1
CANISTER SUCTION 2500CC (MISCELLANEOUS) ×2 IMPLANT
CHLORAPREP W/TINT 26ML (MISCELLANEOUS) ×2 IMPLANT
CLIP APPLIE 5 13 M/L LIGAMAX5 (MISCELLANEOUS) ×1 IMPLANT
COVER SURGICAL LIGHT HANDLE (MISCELLANEOUS) ×2 IMPLANT
DERMABOND ADVANCED (GAUZE/BANDAGES/DRESSINGS) ×1
DERMABOND ADVANCED .7 DNX12 (GAUZE/BANDAGES/DRESSINGS) ×1 IMPLANT
ELECT REM PT RETURN 9FT ADLT (ELECTROSURGICAL) ×2
ELECTRODE REM PT RTRN 9FT ADLT (ELECTROSURGICAL) ×1 IMPLANT
GLOVE BIOGEL PI IND STRL 7.0 (GLOVE) IMPLANT
GLOVE BIOGEL PI IND STRL 8 (GLOVE) IMPLANT
GLOVE BIOGEL PI INDICATOR 7.0 (GLOVE) ×1
GLOVE BIOGEL PI INDICATOR 8 (GLOVE) ×1
GLOVE SURG SIGNA 7.5 PF LTX (GLOVE) ×2 IMPLANT
GLOVE SURG SS PI 7.0 STRL IVOR (GLOVE) ×1 IMPLANT
GLOVE SURG SS PI 8.0 STRL IVOR (GLOVE) ×1 IMPLANT
GOWN STRL REUS W/ TWL LRG LVL3 (GOWN DISPOSABLE) ×2 IMPLANT
GOWN STRL REUS W/ TWL XL LVL3 (GOWN DISPOSABLE) ×1 IMPLANT
GOWN STRL REUS W/TWL LRG LVL3 (GOWN DISPOSABLE) ×2
GOWN STRL REUS W/TWL XL LVL3 (GOWN DISPOSABLE) ×4
KIT BASIN OR (CUSTOM PROCEDURE TRAY) ×2 IMPLANT
KIT ROOM TURNOVER OR (KITS) ×2 IMPLANT
NS IRRIG 1000ML POUR BTL (IV SOLUTION) ×2 IMPLANT
PAD ARMBOARD 7.5X6 YLW CONV (MISCELLANEOUS) ×2 IMPLANT
POUCH SPECIMEN RETRIEVAL 10MM (ENDOMECHANICALS) ×2 IMPLANT
SCISSORS LAP 5X35 DISP (ENDOMECHANICALS) ×2 IMPLANT
SET IRRIG TUBING LAPAROSCOPIC (IRRIGATION / IRRIGATOR) ×2 IMPLANT
SLEEVE ENDOPATH XCEL 5M (ENDOMECHANICALS) ×4 IMPLANT
SPECIMEN JAR SMALL (MISCELLANEOUS) ×2 IMPLANT
SUT MON AB 4-0 PC3 18 (SUTURE) ×2 IMPLANT
SUT VICRYL 0 UR6 27IN ABS (SUTURE) ×1 IMPLANT
TOWEL OR 17X24 6PK STRL BLUE (TOWEL DISPOSABLE) ×2 IMPLANT
TOWEL OR 17X26 10 PK STRL BLUE (TOWEL DISPOSABLE) ×2 IMPLANT
TRAY LAPAROSCOPIC MC (CUSTOM PROCEDURE TRAY) ×2 IMPLANT
TROCAR XCEL BLUNT TIP 100MML (ENDOMECHANICALS) ×2 IMPLANT
TROCAR XCEL NON-BLD 5MMX100MML (ENDOMECHANICALS) ×2 IMPLANT
TUBING INSUFFLATION (TUBING) ×2 IMPLANT

## 2016-07-11 NOTE — Interval H&P Note (Signed)
History and Physical Interval Note: no change in H and P  07/11/2016 6:43 AM  Andrew [name]  has presented today for surgery, with the diagnosis of biliary dyskinesia  The various methods of treatment have been discussed with the patient and family. After consideration of risks, benefits and other options for treatment, the patient has consented to  Procedure(s): LAPAROSCOPIC CHOLECYSTECTOMY (N/A) as a surgical intervention .  The patient's history has been reviewed, patient examined, no change in status, stable for surgery.  I have reviewed the patient's chart and labs.  Questions were answered to the patient's satisfaction.     [name] A

## 2016-07-11 NOTE — Op Note (Signed)
Laparoscopic Cholecystectomy Procedure Note  Indications: This patient presents with symptomatic gallbladder disease and will undergo laparoscopic cholecystectomy.  Pre-operative Diagnosis: biliary dyskinesia  Post-operative Diagnosis: Same  Surgeon: Abigail MiyamotoBLACKMAN,Carleah Yablonski A   Assistants: 0  Anesthesia: General endotracheal anesthesia  ASA Class: 1  Procedure Details  The patient was seen again in the Holding Room. The risks, benefits, complications, treatment options, and expected outcomes were discussed with the patient. The possibilities of reaction to medication, pulmonary aspiration, perforation of viscus, bleeding, recurrent infection, finding a normal gallbladder, the need for additional procedures, failure to diagnose a condition, the possible need to convert to an open procedure, and creating a complication requiring transfusion or operation were discussed with the patient. The likelihood of improving the patient's symptoms with return to their baseline status is good.  The patient and/or family concurred with the proposed plan, giving informed consent. The site of surgery properly noted. The patient was taken to Operating Room, identified as Andrew [name] Podolsky and the procedure verified as Laparoscopic Cholecystectomy with Intraoperative Cholangiogram. A Time Out was held and the above information confirmed.  Prior to the induction of general anesthesia, antibiotic prophylaxis was administered. General endotracheal anesthesia was then administered and tolerated well. After the induction, the abdomen was prepped with Chloraprep and draped in sterile fashion. The patient was positioned in the supine position.  Local anesthetic agent was injected into the skin near the umbilicus and an incision made. We dissected down to the abdominal fascia with blunt dissection.  There was a tiny umbilical hernia. The fascia was incised vertically and we entered the peritoneal cavity bluntly.  A pursestring  suture of 0-Vicryl was placed around the fascial opening.  The Hasson cannula was inserted and secured with the stay suture.  Pneumoperitoneum was then created with CO2 and tolerated well without any adverse changes in the patient's vital signs. A 5-mm port was placed in the subxiphoid position.  Two 5-mm ports were placed in the right upper quadrant. All skin incisions were infiltrated with a local anesthetic agent before making the incision and placing the trocars.   We positioned the patient in reverse Trendelenburg, tilted slightly to the patient's left.  There were multiple adhesions from the omentum to the gallbladder.  The gallbladder was identified, the fundus grasped and retracted cephalad. Adhesions were lysed bluntly and with the electrocautery where indicated, taking care not to injure any adjacent organs or viscus. The infundibulum was grasped and retracted laterally, exposing the peritoneum overlying the triangle of Calot. This was then divided and exposed in a blunt fashion. The cystic duct was clearly identified and bluntly dissected circumferentially. A critical view of the cystic duct and cystic artery was obtained.  The cystic duct was then ligated with clips and divided. The cystic artery was, dissected free, ligated with clips and divided as well.   The gallbladder was dissected from the liver bed in retrograde fashion with the electrocautery. The gallbladder was removed and placed in an Endocatch sac. The liver bed was irrigated and inspected. Hemostasis was achieved with the electrocautery. Copious irrigation was utilized and was repeatedly aspirated until clear.  The gallbladder and Endocatch sac were then removed through the umbilical port site.  The pursestring suture was used to close the umbilical fascia.    We again inspected the right upper quadrant for hemostasis.  Pneumoperitoneum was released as we removed the trocars.  I placed another 0 vicryl suture at the fascial defect  prior to closure. 4-0 Monocryl was used to  close the skin.   Skin glue was then applied. The patient was then extubated and brought to the recovery room in stable condition. Instrument, sponge, and needle counts were correct at closure and at the conclusion of the case.   Findings: Mild Cholecystitis without Cholelithiasis  Estimated Blood Loss: Minimal         Drains: 0         Specimens: Gallbladder           Complications: None; patient tolerated the procedure well.         Disposition: PACU - hemodynamically stable.         Condition: stable

## 2016-07-11 NOTE — Anesthesia Postprocedure Evaluation (Signed)
Anesthesia Post Note  Patient: Andrew [name]  Procedure(s) Performed: Procedure(s) (LRB): LAPAROSCOPIC CHOLECYSTECTOMY (N/A)  Patient location during evaluation: PACU Anesthesia Type: General Level of consciousness: awake and alert and oriented Pain management: pain level controlled Vital Signs Assessment: post-procedure vital signs reviewed and stable Respiratory status: spontaneous breathing, nonlabored ventilation and respiratory function stable Cardiovascular status: blood pressure returned to baseline and stable Postop Assessment: no signs of nausea or vomiting Anesthetic complications: no    Last Vitals:  Vitals:   07/11/16 0915 07/11/16 0930  BP: 132/87 138/87  Pulse: (!) 59 (!) 57  Resp: 11 (!) 8  Temp:      Last Pain:  Vitals:   07/11/16 0930  TempSrc:   PainSc: 3                  Harlym Gehling A.

## 2016-07-11 NOTE — Transfer of Care (Signed)
Immediate Anesthesia Transfer of Care Note  Patient: Andrew [name]  Procedure(s) Performed: Procedure(s): LAPAROSCOPIC CHOLECYSTECTOMY (N/A)  Patient Location: PACU  Anesthesia Type:General  Level of Consciousness: awake, alert , oriented and patient cooperative  Airway & Oxygen Therapy: Patient Spontanous Breathing  Post-op Assessment: Report given to RN and Post -op Vital signs reviewed and stable  Post vital signs: Reviewed and stable  Last Vitals:  Vitals:   07/11/16 0623  BP: 121/79  Pulse: (!) 58  Resp: 20  Temp: 37.1 C    Last Pain:  Vitals:   07/11/16 0623  TempSrc: Oral         Complications: No apparent anesthesia complications

## 2016-07-11 NOTE — Discharge Instructions (Signed)
[facility] ______[facility] LAPAROSCOPIC SURGERY: POST OP INSTRUCTIONS Always review your discharge instruction sheet given to you by the facility where your surgery was performed. IF YOU HAVE DISABILITY OR FAMILY LEAVE FORMS, YOU MUST BRING THEM TO THE OFFICE FOR PROCESSING.   DO NOT GIVE THEM TO YOUR DOCTOR.  1. A prescription for pain medication may be given to you upon discharge.  Take your pain medication as prescribed, if needed.  If narcotic pain medicine is not needed, then you may take acetaminophen (Tylenol) or ibuprofen (Advil) as needed. 2. Take your usually prescribed medications unless otherwise directed. 3. If you need a refill on your pain medication, please contact your pharmacy.  They will contact our office to request authorization. Prescriptions will not be filled after 5pm or on week-ends. 4. You should follow a light diet the first few days after arrival home, such as soup and crackers, etc.  Be sure to include lots of fluids daily. 5. Most patients will experience some swelling and bruising in the area of the incisions.  Ice packs will help.  Swelling and bruising can take several days to resolve.  6. It is common to experience some constipation if taking pain medication after surgery.  Increasing fluid intake and taking a stool softener (such as Colace) will usually help or prevent this problem from occurring.  A mild laxative (Milk of Magnesia or Miralax) should be taken according to package instructions if there are no bowel movements after 48 hours. 7. Unless discharge instructions indicate otherwise, you may remove your bandages 24-48 hours after surgery, and you may shower at that time.  You may have steri-strips (small skin tapes) in place directly over the incision.  These strips should be left on the skin for 7-10 days.  If your surgeon used skin glue on the incision, you may shower in 24 hours.  The glue will flake off over the next 2-3 weeks.  Any sutures or  staples will be removed at the office during your follow-up visit. 8. ACTIVITIES:  You may resume regular (light) daily activities beginning the next day--such as daily self-care, walking, climbing stairs--gradually increasing activities as tolerated.  You may have sexual intercourse when it is comfortable.  Refrain from any heavy lifting or straining until approved by your doctor. a. You may drive when you are no longer taking prescription pain medication, you can comfortably wear a seatbelt, and you can safely maneuver your car and apply brakes. b. RETURN TO WORK:  __________________________________________________________ 9. You should see your doctor in the office for a follow-up appointment approximately 2-3 weeks after your surgery.  Make sure that you call for this appointment within a day or two after you arrive home to insure a convenient appointment time. 10. OTHER INSTRUCTIONS: no lifting more than 15 pounds for 2 weeks 11. Ice pack and ibuprofen also for pain 12. MIRALAX for constipation __________________________________________________________________________________________________________________________ __________________________________________________________________________________________________________________________ WHEN TO CALL YOUR DOCTOR: 1. Fever over 101.0 2. Inability to urinate 3. Continued bleeding from incision. 4. Increased pain, redness, or drainage from the incision. 5. Increasing abdominal pain  The clinic staff is available to answer your questions during regular business hours.  Please dont hesitate to call and ask to speak to one of the nurses for clinical concerns.  If you have a medical emergency, go to the nearest emergency room or call 911.  A surgeon from [facility] is always on call at the hospital. [address], [address], [place], [place]  [address] ? [address], [place], [place]   [phone] ? [phone] ? FAX  [phone] Web site: [email or web address]

## 2016-07-12 ENCOUNTER — Encounter (HOSPITAL_COMMUNITY): Payer: Self-pay | Admitting: Surgery

## 2019-05-14 ENCOUNTER — Other Ambulatory Visit: Payer: Self-pay

## 2019-05-14 ENCOUNTER — Emergency Department (HOSPITAL_COMMUNITY)
Admission: EM | Admit: 2019-05-14 | Discharge: 2019-05-14 | Discharge status: Against Medical Advice | Payer: BLUE CROSS/BLUE SHIELD | Attending: Emergency Medicine | Admitting: Emergency Medicine

## 2019-05-14 DIAGNOSIS — R51 Headache: Secondary | ICD-10-CM | POA: Insufficient documentation

## 2019-05-14 DIAGNOSIS — R0602 Shortness of breath: Secondary | ICD-10-CM | POA: Insufficient documentation

## 2019-05-14 DIAGNOSIS — Z5321 Procedure and treatment not carried out due to patient leaving prior to being seen by health care provider: Secondary | ICD-10-CM | POA: Insufficient documentation

## 2019-05-14 NOTE — ED Notes (Signed)
Pt told EMT he was leaving shortly after arrival to triage due to wait.  Saline lock removed by Philinda, EMT.  Pt left with wife.

## 2019-05-14 NOTE — ED Triage Notes (Signed)
Pt to ED via [facility] with new onset of A-Fib.  EMS reports on there arrival pt's heart rate was 160 and was short of breath.  Pt converted to NS rhythm  Pt st's has also had headache x's 2-3 days

## 2019-07-19 NOTE — Progress Notes (Signed)
Cardiology Office Note:    Date:  07/21/2019   ID:  MAHESH SIZEMORE, DOB 09-23-1970, MRN [id number]  PCP:  [name]  Cardiologist:  No primary care provider on file.   Referring MD: Velna Hatchet, MD   Chief Complaint  Patient presents with  . Atrial Fibrillation    History of Present Illness:    Andrew [name] is a 48 y.o. male with a hx of possible PAF referred by [facility] for consultation and recommendations.Possible recent episode but not documented in ER. He left AMA.  On May 14, 2019 he developed a rapid heart rate that recorded rates between 160 and 210 bpm.  He could feel his heart racing.  He felt funny.  It persisted and his wife called EMS.  An EKG was performed and they notified him that he had atrial fibrillation.  He was transported to the hospital.  The EKG tracings were not given to the emergency room staff.  Therefore, atrial for ablation has not been completely verified although he is very believable.  He has had no recurrence since May 14, 2019.  On that particular date, he was under significant stress including his mother-in-law being on hospice, a new job with high pressure, difficulty sleeping, and remodeling going on in his house.  He has not had a recurrence since the episode.  During the episode with very rapid heart rates he had no chest discomfort, dyspnea, or syncope.  He exercises regularly.  Walks between 2 and 4 miles each day.  He has no symptoms with activity.   Past Medical History:  Diagnosis Date  . Anxiety   . Depression   . GERD (gastroesophageal reflux disease)     Past Surgical History:  Procedure Laterality Date  . CHOLECYSTECTOMY N/A 07/11/2016   Procedure: LAPAROSCOPIC CHOLECYSTECTOMY;  Surgeon: Coralie Keens, MD;  Location: [facility];  Service: General;  Laterality: N/A;  . NASAL POLYP EXCISION    . VASECTOMY      Current Medications: Current Meds  Medication Sig  .  escitalopram (LEXAPRO) 10 MG tablet Take 10 mg by mouth daily.  . fluticasone (FLONASE) 50 MCG/ACT nasal spray Place 2 sprays into both nostrils daily.  Marland Kitchen LORazepam (ATIVAN) 0.5 MG tablet TAKE 1 TABLET TWICE A DAY AS NEEDED FOR ANXIETY  . Multiple Vitamin (MULTIVITAMIN WITH MINERALS) TABS tablet Take 1 tablet by mouth daily.     Allergies:   Patient has no known allergies.   Social History   Socioeconomic History  . Marital status: Married    Spouse name: Not on file  . Number of children: Not on file  . Years of education: Not on file  . Highest education level: Not on file  Occupational History  . Not on file  Social Needs  . Financial resource strain: Not on file  . Food insecurity    Worry: Not on file    Inability: Not on file  . Transportation needs    Medical: Not on file    Non-medical: Not on file  Tobacco Use  . Smoking status: Former Smoker    Quit date: 07/11/2006    Years since quitting: 13.0  . Smokeless tobacco: Never Used  . Tobacco comment: stopped 10 years ago  Substance and Sexual Activity  . Alcohol use: Yes    Comment: occ  . Drug use: No  . Sexual activity: Not on file  Lifestyle  . Physical activity    Days per week:  Not on file    Minutes per session: Not on file  . Stress: Not on file  Relationships  . Social Musician on phone: Not on file    Gets together: Not on file    Attends religious service: Not on file    Active member of club or organization: Not on file    Attends meetings of clubs or organizations: Not on file    Relationship status: Not on file  Other Topics Concern  . Not on file  Social History Narrative  . Not on file     Family History: The patient's family history is not on file.  ROS:   Please see the history of present illness.    He drinks at least 2 beers daily.  He is under a lot of stress on his job.  He has no history of heart disease.  No significant family history of heart disease.  All other  systems reviewed and are negative.  EKGs/Labs/Other Studies Reviewed:    The following studies were reviewed today: No cardiac data  EKG:  EKG formed on May 17, 2019, revealed normal sinus rhythm with slight left axis and left atrial abnormality.  No significant abnormalities are noted.  Recent Labs: No results found for requested labs within last 8760 hours.  Recent Lipid Panel No results found for: CHOL, TRIG, HDL, CHOLHDL, VLDL, LDLCALC, LDLDIRECT  Physical Exam:    VS:  BP 122/86   Pulse (!) 47   Ht 5\' 10"  (1.778 m)   Wt 171 lb 12.8 oz (77.9 kg)   SpO2 99%   BMI 24.65 kg/m     Wt Readings from Last 3 Encounters:  07/21/19 171 lb 12.8 oz (77.9 kg)  07/11/16 164 lb (74.4 kg)  07/10/16 166 lb 8 oz (75.5 kg)     GEN: Healthy-appearing. No acute distress HEENT: Normal NECK: No JVD. LYMPHATICS: No lymphadenopathy CARDIAC: RRR without murmur, gallop, or edema. VASCULAR:  Normal Pulses. No bruits. RESPIRATORY:  Clear to auscultation without rales, wheezing or rhonchi  ABDOMEN: Soft, non-tender, non-distended, No pulsatile mass, MUSCULOSKELETAL: No deformity  SKIN: Warm and dry NEUROLOGIC:  Alert and oriented x 3 PSYCHIATRIC:  Normal affect   ASSESSMENT:    1. PAF (paroxysmal atrial fibrillation) (HCC)   2. Hyperlipidemia LDL goal <70   3. Educated about COVID-19 virus infection    PLAN:    In order of problems listed above:  1. Most certainly had an episode of atrial fibrillation based upon his description and also having a tracing performed with a diagnosis by EMT of A. fib.  Unfortunately a hard copy does not exist.  He has had no symptoms since that time.  He has no risk factors for stroke.  A 2D Doppler echocardiogram will be done to interrogate the structural integrity of the heart.  In the absence of any recurrent symptoms, I have recommended watchful waiting and also purchasing a KARDIA application for his iPhone that will allow him to present tracings if  he has recurrent palpitations.  We will plan to see him on an as-needed basis if symptoms recur.  Have decided against a 30-day monitor as it is likely to be very low yield.  He does not sleep well.  He needs greater than 6 hours sleep per night.  I have asked him to cut back on alcohol intake. 2. Significant elevation and consideration for therapy to prevent future development of vascular disease should be considered by  primary care. 3. 3W's is being practiced to avoid COVID-19.   Medication Adjustments/Labs and Tests Ordered: Current medicines are reviewed at length with the patient today.  Concerns regarding medicines are outlined above.  Orders Placed This Encounter  Procedures  . ECHOCARDIOGRAM COMPLETE   No orders of the defined types were placed in this encounter.   Patient Instructions  Medication Instructions:  Your physician recommends that you continue on your current medications as directed. Please refer to the Current Medication list given to you today.  *If you need a refill on your cardiac medications before your next appointment, please call your pharmacy*  Lab Work: None If you have labs (blood work) drawn today and your tests are completely normal, you will receive your results only by: Marland Kitchen. MyChart Message (if you have MyChart) OR . A paper copy in the mail If you have any lab test that is abnormal or we need to change your treatment, we will call you to review the results.  Testing/Procedures: Your physician has requested that you have an echocardiogram. Echocardiography is a painless test that uses sound waves to create images of your heart. It provides your doctor with information about the size and shape of your heart and how well your heart's chambers and valves are working. This procedure takes approximately one hour. There are no restrictions for this procedure.   Follow-Up: At [facility], you and your health needs are our priority.  As part of our  continuing mission to provide you with exceptional heart care, we have created designated Provider Care Teams.  These Care Teams include your primary Cardiologist (physician) and Advanced Practice Providers (APPs -  Physician Assistants and Nurse Practitioners) who all work together to provide you with the care you need, when you need it.  Your next appointment:   As needed   The format for your next appointment:   In Person  Provider:   You may see Dr. Verdis PrimeHenry Tahara Ruffini or one of the following Advanced Practice Providers on your designated Care Team:    Norma FredricksonLori Gerhardt, NP  Nada BoozerLaura Ingold, NP  Georgie ChardJill McDaniel, NP   Other Instructions  Dr. Katrinka BlazingSmith recommends that you purchase a Kardia Mobile device/app for your phone to monitor your heart rhythm.      Signed, Lesleigh NoeHenry W Aydyn Testerman III, MD  07/21/2019 2:44 PM    [facility]

## 2019-07-21 ENCOUNTER — Ambulatory Visit: Payer: 59 | Admitting: Interventional Cardiology

## 2019-07-21 ENCOUNTER — Other Ambulatory Visit: Payer: Self-pay

## 2019-07-21 ENCOUNTER — Encounter: Payer: Self-pay | Admitting: Interventional Cardiology

## 2019-07-21 VITALS — BP 122/86 | HR 47 | Ht 70.0 in | Wt 171.8 lb

## 2019-07-21 DIAGNOSIS — I480 Paroxysmal atrial fibrillation: Secondary | ICD-10-CM | POA: Diagnosis not present

## 2019-07-21 DIAGNOSIS — Z7189 Other specified counseling: Secondary | ICD-10-CM | POA: Diagnosis not present

## 2019-07-21 DIAGNOSIS — E785 Hyperlipidemia, unspecified: Secondary | ICD-10-CM | POA: Diagnosis not present

## 2019-07-21 NOTE — Patient Instructions (Signed)
Medication Instructions:  Your physician recommends that you continue on your current medications as directed. Please refer to the Current Medication list given to you today.  *If you need a refill on your cardiac medications before your next appointment, please call your pharmacy*  Lab Work: None If you have labs (blood work) drawn today and your tests are completely normal, you will receive your results only by: Marland Kitchen MyChart Message (if you have MyChart) OR . A paper copy in the mail If you have any lab test that is abnormal or we need to change your treatment, we will call you to review the results.  Testing/Procedures: Your physician has requested that you have an echocardiogram. Echocardiography is a painless test that uses sound waves to create images of your heart. It provides your doctor with information about the size and shape of your heart and how well your heart's chambers and valves are working. This procedure takes approximately one hour. There are no restrictions for this procedure.   Follow-Up: At [facility], you and your health needs are our priority.  As part of our continuing mission to provide you with exceptional heart care, we have created designated Provider Care Teams.  These Care Teams include your primary Cardiologist (physician) and Advanced Practice Providers (APPs -  Physician Assistants and Nurse Practitioners) who all work together to provide you with the care you need, when you need it.  Your next appointment:   As needed   The format for your next appointment:   In Person  Provider:   You may see Dr. Daneen Schick or one of the following Advanced Practice Providers on your designated Care Team:    Truitt Merle, NP  Cecilie Kicks, NP  Kathyrn Drown, NP   Other Instructions  Dr. Tamala Julian recommends that you purchase a [place] device/app for your phone to monitor your heart rhythm.

## 2019-08-04 ENCOUNTER — Ambulatory Visit (HOSPITAL_COMMUNITY): Payer: 59 | Attending: Internal Medicine

## 2019-08-04 ENCOUNTER — Other Ambulatory Visit: Payer: Self-pay

## 2019-08-04 DIAGNOSIS — I480 Paroxysmal atrial fibrillation: Secondary | ICD-10-CM

## 2019-11-05 ENCOUNTER — Ambulatory Visit: Payer: 59 | Attending: Internal Medicine

## 2019-11-05 DIAGNOSIS — Z23 Encounter for immunization: Secondary | ICD-10-CM

## 2019-11-05 NOTE — Progress Notes (Signed)
   Covid-19 Vaccination Clinic  Name:  OVERTON BOGGUS    MRN: [id number] DOB: [birth year]  11/05/2019  Mr. Haffey was observed post Covid-19 immunization for 15 minutes without incident. He was provided with Vaccine Information Sheet and instruction to access the V-Safe system.   Mr. Weissberg was instructed to call 911 with any severe reactions post vaccine: Marland Kitchen Difficulty breathing  . Swelling of face and throat  . A fast heartbeat  . A bad rash all over body  . Dizziness and weakness   Immunizations Administered    Name Date Dose VIS Date Route   Pfizer COVID-19 Vaccine 11/05/2019  3:01 PM 0.3 mL 08/06/2019 Intramuscular   Manufacturer: [employer], [employer]   Lot: [id number]   NDC: [address]-3

## 2019-11-29 ENCOUNTER — Ambulatory Visit: Payer: 59 | Attending: Internal Medicine

## 2019-11-29 DIAGNOSIS — Z23 Encounter for immunization: Secondary | ICD-10-CM

## 2019-11-29 NOTE — Progress Notes (Signed)
   Covid-19 Vaccination Clinic  Name:  Andrew [name]    MRN: [id number] DOB: [birth year]  11/29/2019  Andrew [name] was observed post Covid-19 immunization for 15 minutes without incident. He was provided with Vaccine Information Sheet and instruction to access the V-Safe system.   Andrew [name] was instructed to call 911 with any severe reactions post vaccine: Marland Kitchen Difficulty breathing  . Swelling of face and throat  . A fast heartbeat  . A bad rash all over body  . Dizziness and weakness   Immunizations Administered    Name Date Dose VIS Date Route   Pfizer COVID-19 Vaccine 11/29/2019  5:11 PM 0.3 mL 08/06/2019 Intramuscular   Manufacturer: [employer], [employer]   Lot: [id number]   NDC: [address]-6

## 2020-01-10 ENCOUNTER — Emergency Department (HOSPITAL_COMMUNITY)
Admission: EM | Admit: 2020-01-10 | Discharge: 2020-01-10 | Disposition: A | Discharge status: Home / Self Care | Payer: 59 | Attending: Emergency Medicine | Admitting: Emergency Medicine

## 2020-01-10 ENCOUNTER — Other Ambulatory Visit: Payer: Self-pay

## 2020-01-10 ENCOUNTER — Telehealth: Payer: Self-pay | Admitting: Interventional Cardiology

## 2020-01-10 ENCOUNTER — Encounter (HOSPITAL_COMMUNITY): Payer: Self-pay

## 2020-01-10 ENCOUNTER — Emergency Department (HOSPITAL_COMMUNITY): Payer: 59

## 2020-01-10 DIAGNOSIS — I480 Paroxysmal atrial fibrillation: Secondary | ICD-10-CM | POA: Diagnosis not present

## 2020-01-10 DIAGNOSIS — R519 Headache, unspecified: Secondary | ICD-10-CM | POA: Diagnosis not present

## 2020-01-10 DIAGNOSIS — I4891 Unspecified atrial fibrillation: Secondary | ICD-10-CM | POA: Insufficient documentation

## 2020-01-10 LAB — CBC
HCT: 44.7 % (ref 39.0–52.0)
Hemoglobin: 15.7 g/dL (ref 13.0–17.0)
MCH: 33.8 pg (ref 26.0–34.0)
MCHC: 35.1 g/dL (ref 30.0–36.0)
MCV: 96.3 fL (ref 80.0–100.0)
Platelets: 249 10*3/uL (ref 150–400)
RBC: 4.64 MIL/uL (ref 4.22–5.81)
RDW: 11.8 % (ref 11.5–15.5)
WBC: 9.1 10*3/uL (ref 4.0–10.5)
nRBC: 0 % (ref 0.0–0.2)

## 2020-01-10 LAB — BASIC METABOLIC PANEL
Anion gap: 12 (ref 5–15)
BUN: 12 mg/dL (ref 6–20)
CO2: 26 mmol/L (ref 22–32)
Calcium: 9.4 mg/dL (ref 8.9–10.3)
Chloride: 104 mmol/L (ref 98–111)
Creatinine, Ser: 1.05 mg/dL (ref 0.61–1.24)
GFR calc Af Amer: >60 mL/min (ref >60)
GFR calc non Af Amer: >60 mL/min (ref >60)
Glucose, Bld: 104 mg/dL — ABNORMAL HIGH (ref 70–99)
Potassium: 4.6 mmol/L (ref 3.5–5.1)
Sodium: 142 mmol/L (ref 135–145)

## 2020-01-10 NOTE — ED Provider Notes (Signed)
[facility] EMERGENCY DEPARTMENT Provider Note   CSN: [id number] Arrival date & time: 01/10/20  1707     History Chief Complaint  Patient presents with  . Atrial Fibrillation    Andrew [name] is a 49 y.o. male.  HPI   Pt has history of a fib.  Pt noticed his heart racing at 10am. He checked the heart monitor and he was back in afib.   Pt took a lorazepam without relief.  He had a mild headache and lightheaded but no SOB.  No cp but he felt some fatigue in the chest.  Pt saw Dr Katrinka Blazing in the past.  He called the cardiologist and was told to come to the ED.  Past Medical History:  Diagnosis Date  . Anxiety   . Depression   . GERD (gastroesophageal reflux disease)     Patient Active Problem List   Diagnosis Date Noted  . Abdominal pain 05/21/2013  . Nausea 05/21/2013    Past Surgical History:  Procedure Laterality Date  . CHOLECYSTECTOMY N/A 07/11/2016   Procedure: LAPAROSCOPIC CHOLECYSTECTOMY;  Surgeon: Abigail Miyamoto, MD;  Location: [facility] OR;  Service: General;  Laterality: N/A;  . NASAL POLYP EXCISION    . VASECTOMY         No family history on file.  Social History   Tobacco Use  . Smoking status: Former Smoker    Quit date: 07/11/2006    Years since quitting: 13.5  . Smokeless tobacco: Never Used  . Tobacco comment: stopped 10 years ago  Substance Use Topics  . Alcohol use: Yes    Comment: occ  . Drug use: No    Home Medications Prior to Admission medications   Medication Sig Start Date End Date Taking? Authorizing Provider  escitalopram (LEXAPRO) 10 MG tablet Take 10 mg by mouth daily.    [provider]  fluticasone (FLONASE) 50 MCG/ACT nasal spray Place 2 sprays into both nostrils daily.    [provider]  LORazepam (ATIVAN) 0.5 MG tablet TAKE 1 TABLET TWICE A DAY AS NEEDED FOR ANXIETY 05/03/19   [provider]  Multiple Vitamin (MULTIVITAMIN WITH MINERALS) TABS tablet Take 1 tablet by mouth daily.     [provider]    Allergies    Patient has no known allergies.  Review of Systems   Review of Systems  All other systems reviewed and are negative.   Physical Exam Updated Vital Signs BP 103/75   Pulse 63   Temp 97.7 F (36.5 C) (Oral)   Resp 15   Ht 1.778 m (5\' 10" )   Wt 77.1 kg   SpO2 95%   BMI 24.39 kg/m   Physical Exam Vitals and nursing note reviewed.  Constitutional:      General: He is not in acute distress.    Appearance: He is well-developed.  HENT:     Head: Normocephalic and atraumatic.     Right Ear: External ear normal.     Left Ear: External ear normal.  Eyes:     General: No scleral icterus.       Right eye: No discharge.        Left eye: No discharge.     Conjunctiva/sclera: Conjunctivae normal.  Neck:     Trachea: No tracheal deviation.  Cardiovascular:     Rate and Rhythm: Normal rate and regular rhythm.  Pulmonary:     Effort: Pulmonary effort is normal. No respiratory distress.     Breath  sounds: Normal breath sounds. No stridor. No wheezing or rales.  Abdominal:     General: Bowel sounds are normal. There is no distension.     Palpations: Abdomen is soft.     Tenderness: There is no abdominal tenderness. There is no guarding or rebound.  Musculoskeletal:        General: No tenderness.     Cervical back: Neck supple.  Skin:    General: Skin is warm and dry.     Findings: No rash.  Neurological:     Mental Status: He is alert.     Cranial Nerves: No cranial nerve deficit (no facial droop, extraocular movements intact, no slurred speech).     Sensory: No sensory deficit.     Motor: No abnormal muscle tone or seizure activity.     Coordination: Coordination normal.     ED Results / Procedures / Treatments   Labs (all labs ordered are listed, but only abnormal results are displayed) Labs Reviewed  BASIC METABOLIC PANEL - Abnormal; Notable for the following components:      Result Value   Glucose, Bld 104 (*)    All other  components within normal limits  CBC    EKG EKG Interpretation  Date/Time:  Monday Jan 10 2020 18:26:03 EDT Ventricular Rate:  63 PR Interval:    QRS Duration: 98 QT Interval:  384 QTC Calculation: 393 R Axis:   67 Text Interpretation: Sinus rhythm a fib resolved Confirmed by Dorie Rank [phone]) on 01/10/2020 6:34:00 PM   Radiology DG Chest Portable 1 View  Result Date: 01/10/2020 CLINICAL DATA:  AFib. EXAM: PORTABLE CHEST 1 VIEW COMPARISON:  None recent FINDINGS: The heart size and mediastinal contours are within normal limits. Both lungs are clear. The visualized skeletal structures are unremarkable. IMPRESSION: No active disease. Electronically Signed   By: Constance Holster M.D.   On: 01/10/2020 19:15    Procedures Procedures (including critical care time)  Medications Ordered in ED Medications - No data to display  ED Course  I have reviewed the triage vital signs and the nursing notes.  Pertinent labs & imaging results that were available during my care of the patient were reviewed by me and considered in my medical decision making (see chart for details).    MDM Rules/Calculators/A&P     CHA2DS2-VASc Score: 0                 Patient presented to ED with complaints of atrial fibrillation. His initial heart rate was 145. After the patient was hooked up to the monitors he ended up spontaneously converting back to sinus rhythm. Patient was monitored for a few hours. He remained in sinus rhythm without any recurrence. Patient's laboratory tests are unremarkable. No anemia. No hyperkalemia. Patient has known history of paroxysmal atrial fibrillation. Patient will follow up with his cardiologist, Dr. Tamala Julian to discuss further treatment. Final Clinical Impression(s) / ED Diagnoses Final diagnoses:  Atrial fibrillation with rapid ventricular response ([place])  Paroxysmal A-fib [facility])    Rx / DC Orders ED Discharge Orders    None       Dorie Rank, MD 01/10/20 2011

## 2020-01-10 NOTE — Telephone Encounter (Signed)
Patient c/o Palpitations:  High priority if patient c/o lightheadedness, shortness of breath, or chest pain  1) How long have you had palpitations/irregular HR/ Afib? Are you having the symptoms now? Afib, yes. Has a reader at home and it shows he has been in afib since 10:30am  2) Are you currently experiencing lightheadedness, SOB or CP? lightheadedness  3) Do you have a history of afib (atrial fibrillation) or irregular heart rhythm? Yes, saw Dr. Katrinka Blazing for it at his last OV  4) Have you checked your BP or HR? (document readings if available): BP ?? HR 138  5) Are you experiencing any other symptoms? no

## 2020-01-10 NOTE — Telephone Encounter (Signed)
Received Kardia monitor readings from patient which were reviewed with Dr. Excell Seltzer, DOD. Dr. Excell Seltzer confirmed that monitor shows a fib w/RVR - heart rate ranges from 138 - 176 bpm. He advised that patient should go to ED for cardioversion.  I returned call to patient and spoke with his wife, Andrew [name]. I reviewed Dr. Earmon Phoenix advice with her and answered questions to her satisfaction. Hillary agrees to take patient to [facility] ED and is aware to call back with additional questions or concerns. She thanked me for the call.

## 2020-01-10 NOTE — ED Triage Notes (Signed)
Pt states he felt like his heart was racing so he checked his heart rhythm at home and it read a fib, hx of same last year but converted himself back into NSR by the time he got to the hospital. Pt denies chest pain or SOB, pt rate 145-160bpm. Pt a.o, nad noted.

## 2020-01-10 NOTE — Telephone Encounter (Signed)
Returned call to patient and his wife who states patient started having a fib this morning and remains in a fib as far as they can tell currently. States he felt pounding and fluttering in his chest which are the same symptoms he described to Dr. Katrinka Blazing in November. Reports he has been under a great deal of stress. Kardia HR monitor is showing HR readings of 111-176 HR fluctuates and is currently 153 bpm per patient's Kardia monitor. I gave patient and wife instructions for setting up the MyChart account so they can send those ekgs to [place]. I advised once they are received I will review with Dr. Excell Seltzer for advice since Dr. Katrinka Blazing is not in the office today. They do not have a BP cuff but if family member brings over her cuff, they will measure and have that information for me when I call back. They thanked me for the call.

## 2020-01-10 NOTE — Telephone Encounter (Signed)
Was told by triage that they will call patient back.

## 2020-01-10 NOTE — ED Notes (Signed)
Patient verbalizes understanding of discharge instructions. Opportunity for questioning and answers were provided. Armband removed by staff, pt discharged from ED ambulatory to home.  

## 2020-01-11 ENCOUNTER — Telehealth (HOSPITAL_COMMUNITY): Payer: Self-pay

## 2020-01-11 NOTE — Telephone Encounter (Signed)
Reached out to patient to schedule ED f/u. No answer left voicemail. 

## 2020-01-26 ENCOUNTER — Telehealth (HOSPITAL_COMMUNITY): Payer: Self-pay

## 2020-01-26 NOTE — Telephone Encounter (Signed)
Left message for patient to call back regarding follow up appointment from the ED.   

## 2020-02-07 ENCOUNTER — Telehealth: Payer: Self-pay | Admitting: Interventional Cardiology

## 2020-02-07 DIAGNOSIS — I480 Paroxysmal atrial fibrillation: Secondary | ICD-10-CM

## 2020-02-07 NOTE — Telephone Encounter (Signed)
Spoke with patient who states he had another long episode of afib and needs to schedule an appointment. He states he got a call from [facility] to make an appointment and he failed to call them back. He would like to be seen this week if possible. I advised that Dr. Katrinka Blazing is out of the office this week and offered to make him an appointment at [facility]. He is scheduled for tomorrow 6/15. I gave him instructions on location and he thanked me for the call.

## 2020-02-07 NOTE — Telephone Encounter (Signed)
Patient c/o Palpitations:  High priority if patient c/o lightheadedness, shortness of breath, or chest pain  1) How long have you had palpitations/irregular HR/ Afib? Are you having the symptoms now? Saturday night 02/05/20 - 8 hour afib episode.   2) Are you currently experiencing lightheadedness, SOB or CP? no  3) Do you have a history of afib (atrial fibrillation) or irregular heart rhythm? Yes, afib (3rd episode)  4) Have you checked your BP or HR? (document readings if available): Has not been checked yet. An hour away from home.    5) Are you experiencing any other symptoms? Denies any symptoms right now.

## 2020-02-08 ENCOUNTER — Ambulatory Visit (HOSPITAL_COMMUNITY)
Admission: RE | Admit: 2020-02-08 | Discharge: 2020-02-08 | Disposition: A | Discharge status: Home / Self Care | Payer: 59 | Source: Ambulatory Visit | Attending: Nurse Practitioner | Admitting: Nurse Practitioner

## 2020-02-08 ENCOUNTER — Other Ambulatory Visit: Payer: Self-pay

## 2020-02-08 VITALS — BP 128/90 | HR 56 | Ht 70.0 in | Wt 173.0 lb

## 2020-02-08 DIAGNOSIS — F419 Anxiety disorder, unspecified: Secondary | ICD-10-CM | POA: Diagnosis not present

## 2020-02-08 DIAGNOSIS — I480 Paroxysmal atrial fibrillation: Secondary | ICD-10-CM | POA: Insufficient documentation

## 2020-02-08 DIAGNOSIS — Z79899 Other long term (current) drug therapy: Secondary | ICD-10-CM | POA: Insufficient documentation

## 2020-02-08 DIAGNOSIS — Z87891 Personal history of nicotine dependence: Secondary | ICD-10-CM | POA: Insufficient documentation

## 2020-02-08 DIAGNOSIS — F329 Major depressive disorder, single episode, unspecified: Secondary | ICD-10-CM | POA: Insufficient documentation

## 2020-02-08 MED ORDER — DILTIAZEM HCL 30 MG PO TABS: ORAL_TABLET | ORAL | 1 refills | Status: AC

## 2020-02-08 NOTE — Patient Instructions (Signed)
Cardizem 30mg  -- take 1 tablet every 4 hours AS NEEDED for AFIB heart rate >100 as long as top number of blood pressure >100.    Decrease alcohol and caffeine intake  Scheduling will contact you regarding sleep study after insurance approval.

## 2020-02-09 ENCOUNTER — Encounter (HOSPITAL_COMMUNITY): Payer: Self-pay | Admitting: Nurse Practitioner

## 2020-02-09 NOTE — Progress Notes (Signed)
Primary Care Physician: Pa, [facility] Referring Physician: Elesa Hacker street triage Cardiologist: Dr. Barth Kirks is a 49 y.o. male with a h/o paroxysmal afib that is in the afib cliinic for a bout of afib over the weekend. He reports being dx with afib last fall and has had 3 episodes. They will usually self convert within 12 hours. He is in SR today. He is not on anticoagulation for a CHA2DS2VASc score of 0. He does drink alcohol several times a week and a lot of caffeine a day. He does have loud snoring. He does have brady at baseline with HR's in the 50's. He does have a stressful job.   Today, he denies symptoms of palpitations, chest pain, shortness of breath, orthopnea, PND, lower extremity edema, dizziness, presyncope, syncope, or neurologic sequela. The patient is tolerating medications without difficulties and is otherwise without complaint today.   Past Medical History:  Diagnosis Date  . Anxiety   . Depression   . GERD (gastroesophageal reflux disease)    Past Surgical History:  Procedure Laterality Date  . CHOLECYSTECTOMY N/A 07/11/2016   Procedure: LAPAROSCOPIC CHOLECYSTECTOMY;  Surgeon: Abigail Miyamoto, MD;  Location: [facility] OR;  Service: General;  Laterality: N/A;  . NASAL POLYP EXCISION    . VASECTOMY      Current Outpatient Medications  Medication Sig Dispense Refill  . diclofenac (VOLTAREN) 75 MG EC tablet Take 75 mg by mouth daily.    Marland Kitchen escitalopram (LEXAPRO) 10 MG tablet Take 10 mg by mouth daily.    . fluticasone (FLONASE) 50 MCG/ACT nasal spray Place 2 sprays into both nostrils daily.    Marland Kitchen LORazepam (ATIVAN) 0.5 MG tablet Take 0.5 mg by mouth daily.     . Multiple Vitamin (MULTIVITAMIN WITH MINERALS) TABS tablet Take 1 tablet by mouth daily.    Marland Kitchen diltiazem (CARDIZEM) 30 MG tablet Take 1 tablet every 4 hours AS NEEDED for AFIB heart rate >100 45 tablet 1   No current facility-administered medications for this encounter.    No Known  Allergies  Social History   Socioeconomic History  . Marital status: Married    Spouse name: Not on file  . Number of children: Not on file  . Years of education: Not on file  . Highest education level: Not on file  Occupational History  . Not on file  Tobacco Use  . Smoking status: Former Smoker    Quit date: 07/11/2006    Years since quitting: 13.5  . Smokeless tobacco: Never Used  . Tobacco comment: stopped 10 years ago  Substance and Sexual Activity  . Alcohol use: Yes    Comment: occ  . Drug use: No  . Sexual activity: Not on file  Other Topics Concern  . Not on file  Social History Narrative  . Not on file   Social Determinants of Health   Financial Resource Strain:   . Difficulty of Paying Living Expenses:   Food Insecurity:   . Worried About Programme researcher, broadcasting/film/video in the Last Year:   . Barista in the Last Year:   Transportation Needs:   . Freight forwarder (Medical):   Marland Kitchen Lack of Transportation (Non-Medical):   Physical Activity:   . Days of Exercise per Week:   . Minutes of Exercise per Session:   Stress:   . Feeling of Stress :   Social Connections:   . Frequency of Communication with Friends and Family:   .  Frequency of Social Gatherings with Friends and Family:   . Attends Religious Services:   . Active Member of Clubs or Organizations:   . Attends Archivist Meetings:   Marland Kitchen Marital Status:   Intimate Partner Violence:   . Fear of Current or Ex-Partner:   . Emotionally Abused:   Marland Kitchen Physically Abused:   . Sexually Abused:     No family history on file.  ROS- All systems are reviewed and negative except as per the HPI above  Physical Exam: Vitals:   02/08/20 1454  BP: 128/90  Pulse: (!) 56  Weight: 78.5 kg  Height: 5\' 10"  (1.778 m)   Wt Readings from Last 3 Encounters:  02/08/20 78.5 kg  01/10/20 77.1 kg  07/21/19 77.9 kg    Labs: Lab Results  Component Value Date   NA 142 01/10/2020   K 4.6 01/10/2020   CL 104  01/10/2020   CO2 26 01/10/2020   GLUCOSE 104 (H) 01/10/2020   BUN 12 01/10/2020   CREATININE 1.05 01/10/2020   CALCIUM 9.4 01/10/2020   No results found for: INR No results found for: CHOL, HDL, LDLCALC, TRIG   GEN- The patient is well appearing, alert and oriented x 3 today.   Head- normocephalic, atraumatic Eyes-  Sclera clear, conjunctiva pink Ears- hearing intact Oropharynx- clear Neck- supple, no JVP Lymph- no cervical lymphadenopathy Lungs- Clear to ausculation bilaterally, normal work of breathing Heart- Regular rate and rhythm, no murmurs, rubs or gallops, PMI not laterally displaced GI- soft, NT, ND, + BS Extremities- no clubbing, cyanosis, or edema MS- no significant deformity or atrophy Skin- no rash or lesion Psych- euthymic mood, full affect Neuro- strength and sensation are intact  EKG-sinus brady at 56 bpm Echo- 1. Left ventricular ejection fraction, by 3D calculation, is 62%. The left ventricle has normal function. There is no left ventricular hypertrophy. 2. The left ventricle has no regional wall motion abnormalities. 3. Global right ventricle has normal systolic function.The right ventricular size is normal. No increase in right ventricular wall thickness. 4. Left atrial size was normal. 5. Right atrial size was normal. 6. The mitral valve is normal in structure. Trivial mitral valve regurgitation. No evidence of mitral stenosis. 7. The tricuspid valve is normal in structure. Tricuspid valve regurgitation is trivial. 8. The aortic valve is tricuspid. Aortic valve regurgitation is trivial. No evidence of aortic valve sclerosis or stenosis. 9. The pulmonic valve was normal in structure. Pulmonic valve regurgitation is trivial. 10. Normal pulmonary artery systolic pressure. 11. The inferior vena cava is normal in size with greater than 50% respiratory variability, suggesting right atrial pressure of 3 mmHg. 12. The average left ventricular global  longitudinal strain is -19.6 %.   Assessment and Plan: 1. Paroxysmal  afib General education and triggers discussed for afib Only 3 short duration afib episodes since last fall  He will reduce caffeine and alcohol I will give him 30 mg Cardizem to use as needed for spells of afib,instructions how to use discussed  I will not use daily AV blockade  for brady at baseline If Afib escalates despite above changes  can later discuss antiarrythmic's  Sleep study for snoring    2. CHA2DS2VASc score is 0 No need for antiarrythmic's by guidelines at this time   afib clinic as needed   Butch Penny C. Nadiah Corbit, [facility] [address] [place], [place] [address] [phone]

## 2020-02-23 ENCOUNTER — Telehealth: Payer: Self-pay | Admitting: Interventional Cardiology

## 2020-02-23 ENCOUNTER — Other Ambulatory Visit: Payer: Self-pay

## 2020-02-23 ENCOUNTER — Encounter: Payer: Self-pay | Admitting: Physician Assistant

## 2020-02-23 ENCOUNTER — Ambulatory Visit (INDEPENDENT_AMBULATORY_CARE_PROVIDER_SITE_OTHER): Payer: 59 | Admitting: Physician Assistant

## 2020-02-23 VITALS — BP 122/86 | HR 56 | Ht 70.0 in | Wt 171.2 lb

## 2020-02-23 DIAGNOSIS — I4891 Unspecified atrial fibrillation: Secondary | ICD-10-CM | POA: Insufficient documentation

## 2020-02-23 DIAGNOSIS — R0609 Other forms of dyspnea: Secondary | ICD-10-CM | POA: Diagnosis not present

## 2020-02-23 DIAGNOSIS — I480 Paroxysmal atrial fibrillation: Secondary | ICD-10-CM

## 2020-02-23 DIAGNOSIS — F439 Reaction to severe stress, unspecified: Secondary | ICD-10-CM | POA: Diagnosis not present

## 2020-02-23 NOTE — Patient Instructions (Signed)
Medication Instructions:  Your physician recommends that you continue on your current medications as directed. Please refer to the Current Medication list given to you today.  *If you need a refill on your cardiac medications before your next appointment, please call your pharmacy*   Lab Work: None  If you have labs (blood work) drawn today and your tests are completely normal, you will receive your results only by:  MyChart Message (if you have MyChart) OR  A paper copy in the mail If you have any lab test that is abnormal or we need to change your treatment, we will call you to review the results.   Testing/Procedures: None   Follow-Up: Follow up with Dr. Katrinka Blazing on 05/26/20 at 10:00 AM   Other Instructions Avoid decongestants  Decrease caffeine intake

## 2020-02-23 NOTE — Telephone Encounter (Signed)
New Message  Patient c/o Palpitations:  High priority if patient c/o lightheadedness, shortness of breath, or chest pain  1) How long have you had palpitations/irregular HR/ Afib? Are you having the symptoms now? No  2) Are you currently experiencing lightheadedness, SOB or CP? SOB on exertion and sitting, chest pressure  3) Do you have a history of afib (atrial fibrillation) or irregular heart rhythm? Yes  4) Have you checked your BP or HR? (document readings if available): No  5) Are you experiencing any other symptoms? Afib (3 times) and SOB on exertion and sitting, chest pressure

## 2020-02-23 NOTE — Telephone Encounter (Addendum)
Spoke with wife and they have not checked vitals when pt is having these episodes.  She mentioned that she noticed him one morning grab his chest and asked what was going on and he said "I just feel like I can't breathe good.  I think it's just allergies".  He has done this 2-3 more times since then.  Scheduled pt to come in today for evaluation.  Pt just left the house to drop a package at [employer].  Wife is going to call him now and tell him about the appt.  If he is unable to make it, she will have him call to cancel.

## 2020-02-23 NOTE — Telephone Encounter (Signed)
Talked with patient and his wife regarding symptoms. Main complaint of shortness of breath is more of feeling "restricted" in his breathing. It is mainly at rest he is able to play pickleball without issues. He is able to sleep with difficulty or shortness of breath. He spoke with his PCP regarding this last week but md was unsure of cause. He had cxr without abnormality.  Worried something is being missed --- reassured EKG/Echo recently performed were WNL. No afib since last office visit in AF clinic 6/15 - will have pt follow up with Dr. Michaelle Copas office for further evaluation of heaviness in chest and shortness of breath in next week or so. Pt in agreement. ER precautions were reviewed with patient.

## 2020-02-23 NOTE — Progress Notes (Signed)
Cardiology Office Note    Date:  02/23/2020   ID:  AREND BAHL, DOB 1971-04-30, MRN [id number]  PCP:  [facility]  Cardiologist: No primary care provider on file. EPS: None  No chief complaint on file.   History of Present Illness:  GLENN GULLICKSON is a 49 y.o. male history of PAF and usually self converts within 12 hours.  No anticoagulation because of CHA2DS2-VASc score of 0.  Was seen in the A. fib clinic 02/08/20.  He does drink alcohol several times a week and a lot of caffeine daily log snoring and bradycardia at baseline in the 50s as well as a stressful job.  He was asked to reduce caffeine and alcohol.  Given a prescription for Cardizem 30 mg prn, sleep study, if recurrent atrial fibrillation escalates consider antiarrhythmic.  Patient added on my schedule because  he felt like he could not breathe.  He thought it was allergies or stress but his wife wanted him checked. Patient says for about 3 weeks he has to take a deep breath because he doesn't feel like he's getting enough air. No Afib during these times. Says he has a lot of stress at work. Has cut back on caffeine and alcohol Drinking 3-4 cups coffee/day and 2 beers/day. 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 Walks 1-2 miles without problem. Hasn't had to use diltiazem.    Past Medical History:  Diagnosis Date  . Anxiety   . Depression   . GERD (gastroesophageal reflux disease)     Past Surgical History:  Procedure Laterality Date  . CHOLECYSTECTOMY N/A 07/11/2016   Procedure: LAPAROSCOPIC CHOLECYSTECTOMY;  Surgeon: Abigail Miyamoto, MD;  Location: [facility] OR;  Service: General;  Laterality: N/A;  . NASAL POLYP EXCISION    . VASECTOMY      Current Medications: Current Meds  Medication Sig  . diclofenac (VOLTAREN) 75 MG EC tablet Take 75 mg by mouth daily.  Marland Kitchen diltiazem (CARDIZEM) 30 MG tablet Take 1 tablet every 4 hours AS NEEDED for AFIB heart rate >100  .  escitalopram (LEXAPRO) 10 MG tablet Take 10 mg by mouth daily.  . fluticasone (FLONASE) 50 MCG/ACT nasal spray Place 2 sprays into both nostrils daily.  Marland Kitchen LORazepam (ATIVAN) 0.5 MG tablet Take 0.5 mg by mouth daily.   . Multiple Vitamin (MULTIVITAMIN WITH MINERALS) TABS tablet Take 1 tablet by mouth daily.     Allergies:   Patient has no known allergies.   Social History   Socioeconomic History  . Marital status: Married    Spouse name: Not on file  . Number of children: Not on file  . Years of education: Not on file  . Highest education level: Not on file  Occupational History  . Not on file  Tobacco Use  . Smoking status: Former Smoker    Quit date: 07/11/2006    Years since quitting: 13.6  . Smokeless tobacco: Never Used  . Tobacco comment: stopped 10 years ago  Substance and Sexual Activity  . Alcohol use: Yes    Comment: occ  . Drug use: No  . Sexual activity: Not on file  Other Topics Concern  . Not on file  Social History Narrative  . Not on file   Social Determinants of Health   Financial Resource Strain:   . Difficulty of Paying Living Expenses:   Food Insecurity:   . Worried About Programme researcher, broadcasting/film/video in the Last Year:   . Ran  Out of Food in the Last Year:   Transportation Needs:   . Lack of Transportation (Medical):   Marland Kitchen Lack of Transportation (Non-Medical):   Physical Activity:   . Days of Exercise per Week:   . Minutes of Exercise per Session:   Stress:   . Feeling of Stress :   Social Connections:   . Frequency of Communication with Friends and Family:   . Frequency of Social Gatherings with Friends and Family:   . Attends Religious Services:   . Active Member of Clubs or Organizations:   . Attends Banker Meetings:   Marland Kitchen Marital Status:      Family History:  The patient's family history includes Fainting in his maternal grandmother.   ROS:   Please see the history of present illness.    ROS All other systems reviewed and are  negative.   PHYSICAL EXAM:   VS:  BP 122/86   Pulse (!) 56   Ht 5\' 10"  (1.778 m)   Wt 171 lb 3.2 oz (77.7 kg)   SpO2 98%   BMI 24.56 kg/m   Physical Exam  GEN: Thin, in no acute distress  Neck: no JVD, carotid bruits, or masses Cardiac:RRR; no murmurs, rubs, or gallops  Respiratory:  clear to auscultation bilaterally, normal work of breathing GI: soft, nontender, nondistended, + BS Ext: without cyanosis, clubbing, or edema, Good distal pulses bilaterally Neuro:  Alert and Oriented x 3 Psych: euthymic mood, full affect  Wt Readings from Last 3 Encounters:  02/23/20 171 lb 3.2 oz (77.7 kg)  02/08/20 173 lb (78.5 kg)  01/10/20 170 lb (77.1 kg)      Studies/Labs Reviewed:   EKG:  EKG is  ordered today.  The ekg ordered today demonstrates sinus bradycardia unchanged  Recent Labs: 01/10/2020: BUN 12; Creatinine, Ser 1.05; Hemoglobin 15.7; Platelets 249; Potassium 4.6; Sodium 142   Lipid Panel No results found for: CHOL, TRIG, HDL, CHOLHDL, VLDL, LDLCALC, LDLDIRECT  Additional studies/ records that were reviewed today include:   Echo- 1. Left ventricular ejection fraction, by 3D calculation, is 62%. The left ventricle has normal function. There is no left ventricular hypertrophy. 2. The left ventricle has no regional wall motion abnormalities. 3. Global right ventricle has normal systolic function.The right ventricular size is normal. No increase in right ventricular wall thickness. 4. Left atrial size was normal. 5. Right atrial size was normal. 6. The mitral valve is normal in structure. Trivial mitral valve regurgitation. No evidence of mitral stenosis. 7. The tricuspid valve is normal in structure. Tricuspid valve regurgitation is trivial. 8. The aortic valve is tricuspid. Aortic valve regurgitation is trivial. No evidence of aortic valve sclerosis or stenosis. 9. The pulmonic valve was normal in structure. Pulmonic valve regurgitation is trivial. 10. Normal pulmonary  artery systolic pressure. 11. The inferior vena cava is normal in size with greater than 50% respiratory variability, suggesting right atrial pressure of 3 mmHg. 12. The average left ventricular global longitudinal strain is -19.6 %.      ASSESSMENT:    1. Paroxysmal atrial fibrillation (HCC)   2. Other form of dyspnea   3. Stress      PLAN:  In order of problems listed above:  Paroxysmal atrial fibrillation CHA2DS2-VASc score equals 0 so no anticoagulation.  On as needed diltiazem because of baseline bradycardia.hasn't had to use diltiazem  Shortness of breath-more of a need to take a deep breath and he thinks it's related to seasonal allergies or stress.  Take OTC zyrtec or claritin with flonase, avoid decongestants.  Stress/anxiety-asked him to decrease caffeine and offered referral to counselor but he declined.     Medication Adjustments/Labs and Tests Ordered: Current medicines are reviewed at length with the patient today.  Concerns regarding medicines are outlined above.  Medication changes, Labs and Tests ordered today are listed in the Patient Instructions below. Patient Instructions  Medication Instructions:  Your physician recommends that you continue on your current medications as directed. Please refer to the Current Medication list given to you today.  *If you need a refill on your cardiac medications before your next appointment, please call your pharmacy*   Lab Work: None  If you have labs (blood work) drawn today and your tests are completely normal, you will receive your results only by: Marland Kitchen MyChart Message (if you have MyChart) OR . A paper copy in the mail If you have any lab test that is abnormal or we need to change your treatment, we will call you to review the results.   Testing/Procedures: None   Follow-Up: Follow up with Dr. Katrinka Blazing on 05/26/20 at 10:00 AM   Other Instructions Avoid decongestants  Decrease caffeine intake      Signed, Jacolyn Reedy, PA-C  02/23/2020 12:57 PM    [facility] [address] [place], [place], [place]  [address] Phone: [phone]; Fax: [phone]

## 2020-05-22 NOTE — Progress Notes (Signed)
Cardiology Office Note:    Date:  05/26/2020   ID:  Andrew [name], DOB 06-Feb-1971, MRN [id number]  [facility]:  [facility]  Cardiologist:  No primary care provider on file.   Referring MD: [facility]   Chief Complaint  Patient presents with  . Atrial Fibrillation    History of Present Illness:    Andrew AlaminMarcus B Mcevoy is a 49 y.o. male with a hx of PAF (lasts up to 12 hours), aortic atherosclerosis, not anticoagulate (CHA2DS2-VASc score of 1).  Was seen in the A. fib clinic 02/08/20, drinks alcohol several times a week and caffeine daily, loud snoring, and bradycardia (50's), and stressful job.  Andrew [name] has had at least 2 episodes of atrial fibrillation that have lasted up to 8 hours and in 1 instance 12 hours.  He would then have spontaneous resolution.  He had no episodes prior to the initial episode and 2021.  He also had an episode of tachycardia in November 2020 that was not documented by EMS EKG recording.  That episode was very similar to the ones that occurred in 2021.    Past Medical History:  Diagnosis Date  . Anxiety   . Depression   . GERD (gastroesophageal reflux disease)     Past Surgical History:  Procedure Laterality Date  . CHOLECYSTECTOMY N/A 07/11/2016   Procedure: LAPAROSCOPIC CHOLECYSTECTOMY;  Surgeon: Abigail Miyamotoouglas Blackman, MD;  Location: [facility] OR;  Service: General;  Laterality: N/A;  . NASAL POLYP EXCISION    . VASECTOMY      Current Medications: Current Meds  Medication Sig  . diclofenac (VOLTAREN) 75 MG EC tablet Take 75 mg by mouth as needed.   . diltiazem (CARDIZEM) 30 MG tablet Take 1 tablet every 4 hours AS NEEDED for AFIB heart rate >100  . escitalopram (LEXAPRO) 10 MG tablet Take 10 mg by mouth daily.  . fluticasone (FLONASE) 50 MCG/ACT nasal spray Place 2 sprays into both nostrils daily.  Marland Kitchen. LORazepam (ATIVAN) 0.5 MG tablet Take 0.5 mg by mouth daily.   . Multiple Vitamin (MULTIVITAMIN WITH MINERALS) TABS tablet Take 1  tablet by mouth daily.     Allergies:   Patient has no known allergies.   Social History   Socioeconomic History  . Marital status: Married    Spouse name: Not on file  . Number of children: Not on file  . Years of education: Not on file  . Highest education level: Not on file  Occupational History  . Not on file  Tobacco Use  . Smoking status: Former Smoker    Quit date: 07/11/2006    Years since quitting: 13.8  . Smokeless tobacco: Never Used  . Tobacco comment: stopped 10 years ago  Substance and Sexual Activity  . Alcohol use: Yes    Comment: occ  . Drug use: No  . Sexual activity: Not on file  Other Topics Concern  . Not on file  Social History Narrative  . Not on file   Social Determinants of Health   Financial Resource Strain:   . Difficulty of Paying Living Expenses: Not on file  Food Insecurity:   . Worried About Programme researcher, broadcasting/film/videounning Out of Food in the Last Year: Not on file  . Ran Out of Food in the Last Year: Not on file  Transportation Needs:   . Lack of Transportation (Medical): Not on file  . Lack of Transportation (Non-Medical): Not on file  Physical Activity:   . Days of Exercise  per Week: Not on file  . Minutes of Exercise per Session: Not on file  Stress:   . Feeling of Stress : Not on file  Social Connections:   . Frequency of Communication with Friends and Family: Not on file  . Frequency of Social Gatherings with Friends and Family: Not on file  . Attends Religious Services: Not on file  . Active Member of Clubs or Organizations: Not on file  . Attends Banker Meetings: Not on file  . Marital Status: Not on file     Family History: The patient's family history includes Fainting in his maternal grandmother.  ROS:   Please see the history of present illness.    He drinks 2-3 beers daily.  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  He has no vascular disease history.  Reviewing records reveals he does have aortic  atherosclerosis noted on abdominal CT scan in 2017.  He tends to be all other systems reviewed and are negative.  EKGs/Labs/Other Studies Reviewed:    The following studies were reviewed today:  CT scan abdomen 2017 IMPRESSION:  No acute finding. No finding to explain the patient's symptoms. Mild  atherosclerosis noted.   2D Doppler echocardiogram 08/04/2019: IMPRESSIONS    1. Left ventricular ejection fraction, by 3D calculation, is 62%. The  left ventricle has normal function. There is no left ventricular  hypertrophy.  2. The left ventricle has no regional wall motion abnormalities.  3. Global right ventricle has normal systolic function.The right  ventricular size is normal. No increase in right ventricular wall  thickness.  4. Left atrial size was normal.  5. Right atrial size was normal.  6. The mitral valve is normal in structure. Trivial mitral valve  regurgitation. No evidence of mitral stenosis.  7. The tricuspid valve is normal in structure. Tricuspid valve  regurgitation is trivial.  8. The aortic valve is tricuspid. Aortic valve regurgitation is trivial.  No evidence of aortic valve sclerosis or stenosis.  9. The pulmonic valve was normal in structure. Pulmonic valve  regurgitation is trivial.  10. Normal pulmonary artery systolic pressure.  11. The inferior vena cava is normal in size with greater than 50%  respiratory variability, suggesting right atrial pressure of 3 mmHg.  12. The average left ventricular global longitudinal strain is -19.6 %.    EKG:  EKG a new tracing is unknown available.  Prior tracing in normal sinus rhythm from February 23, 2020 is normal, demonstrating sinus bradycardia.  Recent Labs: 01/10/2020: BUN 12; Creatinine, Ser 1.05; Hemoglobin 15.7; Platelets 249; Potassium 4.6; Sodium 142  Recent Lipid Panel No results found for: CHOL, TRIG, HDL, CHOLHDL, VLDL, LDLCALC, LDLDIRECT  Physical Exam:    VS:  BP 118/68   Pulse (!) 57    Ht 5\' 10"  (1.778 m)   Wt 173 lb 3.2 oz (78.6 kg)   SpO2 97%   BMI 24.85 kg/m     Wt Readings from Last 3 Encounters:  05/26/20 173 lb 3.2 oz (78.6 kg)  02/23/20 171 lb 3.2 oz (77.7 kg)  02/08/20 173 lb (78.5 kg)     GEN: Appearance is compatible with age.. No acute distress HEENT: Normal NECK: No JVD. LYMPHATICS: No lymphadenopathy CARDIAC:  RRR without murmur, gallop, or edema. VASCULAR:  Normal Pulses. No bruits. RESPIRATORY:  Clear to auscultation without rales, wheezing or rhonchi  ABDOMEN: Soft, non-tender, non-distended, No pulsatile mass, MUSCULOSKELETAL: No deformity  SKIN: Warm and dry  NEUROLOGIC:  Alert and oriented x 3 PSYCHIATRIC:  Normal affect   ASSESSMENT:    1. Paroxysmal atrial fibrillation (HCC)   2. SOB (shortness of breath)   3. Hyperlipidemia LDL goal <70   4. Other form of dyspnea   5. Snoring   6. Educated about COVID-19 virus infection    PLAN:    In order of problems listed above:  1. We discussed the risk of stroke and his particular clinical situation.  The updated Chads Vascular score is 1 based upon the finding of coincidental atherosclerosis in the aorta noted on abdominal CT scan in 2017.  Educated about management strategy including the need for anticoagulation if CHA2DS2-VASc is 2 or greater.  81 mg aspirin per day is recommended at this time.  If episodes become more disruptive or prolonged, antiarrhythmic therapy may be needed.  May have to escalate to ablation if control is not achieved with medical management.  Recommended 30-day monitor to establish atrial fibrillation burden. 2. Proton pump inhibitor therapy was started at last visit  -> denies dyspnea now on therapy. 3. LDL was 117.  Relative to primary prevention and the appearance of aortic atherosclerosis on abdominal CT 4 years ago, statin therapy to lower the LDL to less than 70 as indicated.  This was discussed with the patient who would like to try lifestyle changes before  starting medical therapy.  He should have a lipid panel done within the next 6 months to follow-up progress with diet and exercise. 4. This is not addressed in any significant way.  He now states that his wife has not mentioned snoring in quite some time. 5. He is practicing prevention and is fully vaccinated.  1 year follow-up or earlier if atrial fibrillation worsens.  Overall education and awareness concerning primary risk prevention was discussed in detail: LDL less than 70, hemoglobin A1c less than 7, blood pressure target less than 130/80 mmHg, >150 minutes of moderate aerobic activity per week, avoidance of smoking, weight control (via diet and exercise), and continued surveillance/management of/for obstructive sleep apnea.    Medication Adjustments/Labs and Tests Ordered: Current medicines are reviewed at length with the patient today.  Concerns regarding medicines are outlined above.  Orders Placed This Encounter  Procedures  . Cardiac event monitor   Meds ordered this encounter  Medications  . DISCONTD: rosuvastatin (CRESTOR) 5 MG tablet    Sig: Take 1 tablet (5 mg total) by mouth daily.    Dispense:  90 tablet    Refill:  3    Patient Instructions  Medication Instructions:  Your physician recommends that you continue on your current medications as directed. Please refer to the Current Medication list given to you today.  *If you need a refill on your cardiac medications before your next appointment, please call your pharmacy*   Lab Work: None  If you have labs (blood work) drawn today and your tests are completely normal, you will receive your results only by: Marland Kitchen MyChart Message (if you have MyChart) OR . A paper copy in the mail If you have any lab test that is abnormal or we need to change your treatment, we will call you to review the results.   Testing/Procedures: Your physician has recommended that you wear an event monitor. Event monitors are medical devices  that record the heart's electrical activity. Doctors most often [place] these monitors to diagnose arrhythmias. Arrhythmias are problems with the speed or rhythm of the heartbeat. The monitor is a small,  portable device. You can wear one while you do your normal daily activities. This is usually used to diagnose what is causing palpitations/syncope (passing out).    Follow-Up: At [facility], you and your health needs are our priority.  As part of our continuing mission to provide you with exceptional heart care, we have created designated Provider Care Teams.  These Care Teams include your primary Cardiologist (physician) and Advanced Practice Providers (APPs -  Physician Assistants and Nurse Practitioners) who all work together to provide you with the care you need, when you need it.  We recommend signing up for the patient portal called "MyChart".  Sign up information is provided on this After Visit Summary.  MyChart is used to connect with patients for Virtual Visits (Telemedicine).  Patients are able to view lab/test results, encounter notes, upcoming appointments, etc.  Non-urgent messages can be sent to your provider as well.   To learn more about what you can do with MyChart, go to [email or web address].    Your next appointment:   12 month(s)  The format for your next appointment:   In Person  Provider:   You may see Dr. Verdis Prime or one of the following Advanced Practice Providers on your designated Care Team:    Norma Fredrickson, NP  Nada Boozer, NP  Georgie Chard, NP    Other Instructions      Signed, Lesleigh Noe, MD  05/26/2020 11:14 AM    [facility]

## 2020-05-26 ENCOUNTER — Encounter: Payer: Self-pay | Admitting: Interventional Cardiology

## 2020-05-26 ENCOUNTER — Ambulatory Visit (INDEPENDENT_AMBULATORY_CARE_PROVIDER_SITE_OTHER): Payer: 59 | Admitting: Interventional Cardiology

## 2020-05-26 ENCOUNTER — Other Ambulatory Visit: Payer: Self-pay

## 2020-05-26 VITALS — BP 118/68 | HR 57 | Ht 70.0 in | Wt 173.2 lb

## 2020-05-26 DIAGNOSIS — R0609 Other forms of dyspnea: Secondary | ICD-10-CM

## 2020-05-26 DIAGNOSIS — R0683 Snoring: Secondary | ICD-10-CM

## 2020-05-26 DIAGNOSIS — R0602 Shortness of breath: Secondary | ICD-10-CM | POA: Diagnosis not present

## 2020-05-26 DIAGNOSIS — E785 Hyperlipidemia, unspecified: Secondary | ICD-10-CM | POA: Diagnosis not present

## 2020-05-26 DIAGNOSIS — Z7189 Other specified counseling: Secondary | ICD-10-CM

## 2020-05-26 DIAGNOSIS — I480 Paroxysmal atrial fibrillation: Secondary | ICD-10-CM

## 2020-05-26 MED ORDER — ROSUVASTATIN CALCIUM 5 MG PO TABS: 5.0000 mg | ORAL_TABLET | Freq: Every day | ORAL | 3 refills | Status: DC

## 2020-05-26 NOTE — Patient Instructions (Addendum)
Medication Instructions:  Your physician recommends that you continue on your current medications as directed. Please refer to the Current Medication list given to you today.  *If you need a refill on your cardiac medications before your next appointment, please call your pharmacy*   Lab Work: None If you have labs (blood work) drawn today and your tests are completely normal, you will receive your results only by: . MyChart Message (if you have MyChart) OR . A paper copy in the mail If you have any lab test that is abnormal or we need to change your treatment, we will call you to review the results.   Testing/Procedures: Your physician has recommended that you wear an event monitor. Event monitors are medical devices that record the heart's electrical activity. Doctors most often us these monitors to diagnose arrhythmias. Arrhythmias are problems with the speed or rhythm of the heartbeat. The monitor is a small, portable device. You can wear one while you do your normal daily activities. This is usually used to diagnose what is causing palpitations/syncope (passing out).    Follow-Up: At [facility], you and your health needs are our priority.  As part of our continuing mission to provide you with exceptional heart care, we have created designated Provider Care Teams.  These Care Teams include your primary Cardiologist (physician) and Advanced Practice Providers (APPs -  Physician Assistants and Nurse Practitioners) who all work together to provide you with the care you need, when you need it.  We recommend signing up for the patient portal called "MyChart".  Sign up information is provided on this After Visit Summary.  MyChart is used to connect with patients for Virtual Visits (Telemedicine).  Patients are able to view lab/test results, encounter notes, upcoming appointments, etc.  Non-urgent messages can be sent to your provider as well.   To learn more about what you can do with  MyChart, go to [email or web address].    Your next appointment:   12 month(s)  The format for your next appointment:   In Person  Provider:   You may see Dr. Henry Smith or one of the following Advanced Practice Providers on your designated Care Team:    Lori Gerhardt, NP  Laura Ingold, NP  Jill McDaniel, NP    Other Instructions   

## 2020-06-12 ENCOUNTER — Encounter (INDEPENDENT_AMBULATORY_CARE_PROVIDER_SITE_OTHER): Payer: 59

## 2020-06-12 DIAGNOSIS — I480 Paroxysmal atrial fibrillation: Secondary | ICD-10-CM | POA: Diagnosis not present

## 2020-07-05 ENCOUNTER — Ambulatory Visit (INDEPENDENT_AMBULATORY_CARE_PROVIDER_SITE_OTHER): Payer: 59 | Admitting: Podiatry

## 2020-07-05 ENCOUNTER — Ambulatory Visit (INDEPENDENT_AMBULATORY_CARE_PROVIDER_SITE_OTHER): Payer: 59

## 2020-07-05 ENCOUNTER — Other Ambulatory Visit: Payer: Self-pay

## 2020-07-05 ENCOUNTER — Encounter: Payer: Self-pay | Admitting: Podiatry

## 2020-07-05 DIAGNOSIS — M76822 Posterior tibial tendinitis, left leg: Secondary | ICD-10-CM

## 2020-07-05 DIAGNOSIS — M79672 Pain in left foot: Secondary | ICD-10-CM

## 2020-07-05 NOTE — Progress Notes (Signed)
Subjective:   Patient ID: Andrew [name], male   DOB: 49 y.o.   MRN: [id number]   HPI Patient states he rolled his ankle around 8 months ago and it has been giving him trouble since.  He has had a boot that is not really been effective and it is quite sore and he like to be active more but is not been able to.  Patient does not smoke likes to be active   Review of Systems  All other systems reviewed and are negative.       Objective:  Physical Exam Vitals and nursing note reviewed.  Constitutional:      Appearance: He is well-developed.  Pulmonary:     Effort: Pulmonary effort is normal.  Musculoskeletal:        General: Normal range of motion.  Skin:    General: Skin is warm.  Neurological:     Mental Status: He is alert.     Neurovascular status intact muscle strength adequate range of motion within normal limits.  Patient is noted to have inflammation more in the medial side of the left ankle around the posterior tibial tendon as it comes under the medial malleolus with no indications of muscle strength loss or pathological flatfoot deformity.  Pain in the rest of the foot difficult to identify or in the ankle and patient has good digit perfusion well oriented x3     Assessment:  Inflammatory changes posterior tibial tendon left with no current indication of rupture but cannot rule out interstitial tear     Plan:  H&P x-ray reviewed condition discussed.  At this time I did careful sheath injection left 3 mg Dexasone Kenalog 5 mg Xylocaine advised on boot usage that he has at home and dispensed fascial brace that I started.  Discussed MRI if symptoms were to persist and not get better  X-rays indicate that there is mild depression of the arch but no signs of advanced pathology with no other pathology noted

## 2020-07-11 ENCOUNTER — Other Ambulatory Visit: Payer: Self-pay | Admitting: Podiatry

## 2020-07-11 DIAGNOSIS — M76822 Posterior tibial tendinitis, left leg: Secondary | ICD-10-CM

## 2020-07-26 ENCOUNTER — Other Ambulatory Visit: Payer: Self-pay

## 2020-07-26 ENCOUNTER — Ambulatory Visit (INDEPENDENT_AMBULATORY_CARE_PROVIDER_SITE_OTHER): Payer: 59 | Admitting: Podiatry

## 2020-07-26 ENCOUNTER — Encounter: Payer: Self-pay | Admitting: Podiatry

## 2020-07-26 DIAGNOSIS — M2141 Flat foot [pes planus] (acquired), right foot: Secondary | ICD-10-CM | POA: Diagnosis not present

## 2020-07-26 DIAGNOSIS — M2142 Flat foot [pes planus] (acquired), left foot: Secondary | ICD-10-CM

## 2020-07-26 DIAGNOSIS — M76822 Posterior tibial tendinitis, left leg: Secondary | ICD-10-CM

## 2020-07-26 NOTE — Progress Notes (Signed)
Subjective:   Patient ID: [name], male   DOB: 49 y.o.   MRN: [id number]   HPI Patient presents stating his pain has improved with the boot and medication but he is concerned that when he resumes activity it is going to be a problem again and he does have a 28-month history of this problem   ROS      Objective:  Physical Exam  Neurovascular status intact with discomfort that has improved quite dramatically left plantar fascia with patient being very active and having moderate flatfoot deformity     Assessment:  Plan treat fasciitis left still present but improved with moderate flatfoot deformity     Plan:  H&P reviewed condition and today I discussed long-term orthotics to support the arch educated him on this and patient wants orthotics.  I educated him on plantar fasciitis and gradual increase in activity and I encouraged patient to slowly add to his activity at this time.  Casted for functional orthotics at this time

## 2020-08-28 ENCOUNTER — Telehealth: Payer: Self-pay | Admitting: Podiatry

## 2020-08-28 NOTE — Telephone Encounter (Signed)
pts wife left message asking about pts orthotics that he was measured for a few weeks ago.  I returned call and left message that the manufacturer is behind due to end of year rush on orders and they have been taken about 4 to 5 weeks to come in but I would call when they come in to get pt scheduled to pick them up.Marland KitchenMarland Kitchen

## 2020-11-27 ENCOUNTER — Telehealth: Payer: Self-pay | Admitting: Podiatry

## 2020-11-27 NOTE — Telephone Encounter (Signed)
pts wife left message Friday @ 346pm stating pt never picked up orthotics and he is still having issues with his foot. Swelling and feels like growth on foot. She was wanting to schedule an appt to see the doctor and puo.  I returned call and left message for pts wife to call to schedule an appt and pt can pick o=up orthotics at the same appt when they see the doctor.

## 2020-12-27 ENCOUNTER — Encounter: Payer: Self-pay | Admitting: Podiatry

## 2020-12-27 ENCOUNTER — Ambulatory Visit (INDEPENDENT_AMBULATORY_CARE_PROVIDER_SITE_OTHER): Payer: 59 | Admitting: Podiatry

## 2020-12-27 ENCOUNTER — Other Ambulatory Visit: Payer: Self-pay

## 2020-12-27 DIAGNOSIS — M2141 Flat foot [pes planus] (acquired), right foot: Secondary | ICD-10-CM | POA: Diagnosis not present

## 2020-12-27 DIAGNOSIS — M2142 Flat foot [pes planus] (acquired), left foot: Secondary | ICD-10-CM

## 2020-12-27 DIAGNOSIS — M76822 Posterior tibial tendinitis, left leg: Secondary | ICD-10-CM | POA: Diagnosis not present

## 2020-12-27 NOTE — Progress Notes (Signed)
Subjective:   Patient ID: Andrew [name], male   DOB: 50 y.o.   MRN: [id number]   HPI Patient states this place on my left foot has been slightly irritated improved but I just wanted it checked and I am here to get orthotics   ROS      Objective:  Physical Exam  Neurovascular status found to be intact muscle strength found to be adequate with inflammation around the posterior tibial tendon left as it comes under the medial malleolus localized to this area with good muscle function and no indications of pathology from that standpoint     Assessment:  Posterior tibial tendinitis left improving but still present 1 area     Plan:  H&P reviewed condition utilize aggressive ice heat and also orthotics which were dispensed today with instructions.  Patient will be seen back as needed

## 2021-01-15 ENCOUNTER — Telehealth: Payer: Self-pay | Admitting: Interventional Cardiology

## 2021-01-15 NOTE — Telephone Encounter (Signed)
Pt c/o BP issue: STAT if pt c/o blurred vision, one-sided weakness or slurred speech  1. What are your last 5 BP readings? 145/95  2. Are you having any other symptoms (ex. Dizziness, headache, blurred vision, passed out)? Unknown  3. What is your BP issue?  Pt's wife called in requesting a appt for her husband due to his BP.She didn't have any information she just kept stating his bp was high.Advised the next available is 8/3 she requested a phone note be sent to get him worked in,

## 2021-01-15 NOTE — Telephone Encounter (Signed)
Called pt and left message for him to call back due to wife not having further information.

## 2021-01-17 NOTE — Telephone Encounter (Signed)
Left message to call back  

## 2021-01-18 NOTE — Telephone Encounter (Signed)
Left message for pt to call back.  Advised on message that I was reaching out to see if he had any other readings as we normally don't change medications or bring people in for one high reading. Asked to call back with more readings, if he had any symptoms and if he has had increased salt in his diet recently.

## 2021-01-26 NOTE — Telephone Encounter (Signed)
Left message for pt to call back in regards to blood pressure or send MyChart message with more readings.

## 2021-01-29 NOTE — Telephone Encounter (Signed)
Several attempts have been made to contact pt to get further information about BP.  Pt has not returned calls.  Detailed message left on 6/3 advising pt to call back with more blood pressure readings if there was still a concern.  I will close this note.

## 2021-01-31 ENCOUNTER — Telehealth: Payer: Self-pay | Admitting: Interventional Cardiology

## 2021-01-31 ENCOUNTER — Other Ambulatory Visit: Payer: Self-pay | Admitting: *Deleted

## 2021-01-31 MED ORDER — DILTIAZEM HCL ER COATED BEADS 180 MG PO CP24: 180.0000 mg | ORAL_CAPSULE | Freq: Every day | ORAL | 3 refills | Status: DC

## 2021-01-31 NOTE — Telephone Encounter (Signed)
Spoke with pt and he states BP today is 155/103.  Has had multiple readings for some time now 130-150s/90-100s.  Denies any symptoms other than just generally feeling bad.  Denies increased salt in diet.  Not currently on any BP medications.  Advised I will speak with Dr. Katrinka Blazing and call back with instructions.

## 2021-01-31 NOTE — Telephone Encounter (Signed)
Spoke with pt and he wanted to make sure the 180mg  dose was correct since he was previously only taking 30mg  for breakthrough.  Explained to the pt the differences between the two medications and why we wanted him on the long acting.  Pt verbalized understanding and was appreciative for call.

## 2021-01-31 NOTE — Telephone Encounter (Signed)
PT is returning several calls

## 2021-01-31 NOTE — Telephone Encounter (Signed)
Spoke with Dr. Katrinka Blazing and he said to have Andrew [name] start Diltiazem CD 180mg  QD, monitor BP daily for 2 weeks and report.  Spoke with Andrew [name] and made him aware of recommendations.  Andrew [name] agreeable to plan.

## 2021-01-31 NOTE — Telephone Encounter (Signed)
Pt c/o medication issue:  1. Name of Medication: diltiazem (CARDIZEM) 30 MG tablet diltiazem (CARDIZEM CD) 180 MG 24 hr capsule 2. How are you currently taking this medication (dosage and times per day)? Not currently taking   3. Are you having a reaction (difficulty breathing--STAT)? No   4. What is your medication issue? Pt is questioning the dosage and the instructions of this medication.

## 2021-02-05 ENCOUNTER — Telehealth (INDEPENDENT_AMBULATORY_CARE_PROVIDER_SITE_OTHER): Payer: Self-pay | Admitting: "Endocrinology

## 2021-02-05 DIAGNOSIS — E039 Hypothyroidism, unspecified: Secondary | ICD-10-CM

## 2021-02-05 NOTE — Telephone Encounter (Signed)
Marc's wife, Sherron Monday called me. I take care of her thyroid disease and she asked me what should be done for her husband.  He recently had a [facility] exam that included blood work. His TSH was 12.9. The comment on the [facility] paperwork was that he was very hypothyroid. I talked with him. He has been very lethargic for several months.  It appears that he has acquired hypothyroidism, most likely secondary to Hashimoto's disease.   I asked him to come in for lab tests tomorrow or Wednesday. Since he will then be going out of town, I will add him on to my schedule on 02/13/21 at 4:15 PM.  Molli Knock, MD, CDE

## 2021-02-07 ENCOUNTER — Telehealth (INDEPENDENT_AMBULATORY_CARE_PROVIDER_SITE_OTHER): Payer: Self-pay | Admitting: "Endocrinology

## 2021-02-07 LAB — THYROID PEROXIDASE ANTIBODY: Thyroperoxidase Ab SerPl-aCnc: 1 IU/mL (ref <9)

## 2021-02-07 LAB — TSH: TSH: 1.15 mIU/L (ref 0.40–4.50)

## 2021-02-07 LAB — THYROGLOBULIN ANTIBODY: Thyroglobulin Ab: <1 IU/mL (ref <=1)

## 2021-02-07 LAB — T4, FREE: Free T4: 1.4 ng/dL (ref 0.8–1.8)

## 2021-02-07 LAB — T3, FREE: T3, Free: 3.3 pg/mL (ref 2.3–4.2)

## 2021-02-12 ENCOUNTER — Other Ambulatory Visit: Payer: Self-pay

## 2021-02-12 ENCOUNTER — Ambulatory Visit (INDEPENDENT_AMBULATORY_CARE_PROVIDER_SITE_OTHER): Payer: 59 | Admitting: "Endocrinology

## 2021-02-12 ENCOUNTER — Telehealth (INDEPENDENT_AMBULATORY_CARE_PROVIDER_SITE_OTHER): Payer: Self-pay | Admitting: "Endocrinology

## 2021-02-12 ENCOUNTER — Encounter (INDEPENDENT_AMBULATORY_CARE_PROVIDER_SITE_OTHER): Payer: Self-pay | Admitting: "Endocrinology

## 2021-02-12 VITALS — BP 110/70 | HR 74 | Wt 174.6 lb

## 2021-02-12 DIAGNOSIS — R7989 Other specified abnormal findings of blood chemistry: Secondary | ICD-10-CM | POA: Diagnosis not present

## 2021-02-12 DIAGNOSIS — R5383 Other fatigue: Secondary | ICD-10-CM | POA: Diagnosis not present

## 2021-02-12 DIAGNOSIS — I480 Paroxysmal atrial fibrillation: Secondary | ICD-10-CM

## 2021-02-12 DIAGNOSIS — E010 Iodine-deficiency related diffuse (endemic) goiter: Secondary | ICD-10-CM | POA: Diagnosis not present

## 2021-02-12 NOTE — Patient Instructions (Signed)
Follow up visit in 3 months. 

## 2021-02-12 NOTE — Progress Notes (Signed)
Subjective:  Patient Name: Andrew [name] Date of Birth: 1971/08/18  MRN: [id number]  Andrew [name]  presents to the office today, in referral from his wife, for initial endocrine consultation for the chief complaint of abnormal TSH on a [facility] screening exam.   HISTORY OF PRESENT ILLNESS:   Andrew [name] is a 49 y.o. Caucasian gentleman.  Andrew [name] was unaccompanied.   1. Andrew [name] had his initial endocrine consultation on 02/12/21:  A. When he had a [facility] screening on 01/19/21 he had the following lab results;   1). TSH 12.98 (ref 0.58-5.50)   2). Total cholesterol 190. Triglycerides 99, HDL 52, LDL 99   3). CRP 0.90 (ref 1.1-3.0)   4). BMD 1.014 g/cm2 - low probability of osteoporosis   5). Glucose 93   6). BP 133/52, BMI 25, waist circumference 33    7). Mild carotid artery disease  B. The technician performing his carotid study told him that his thyroid gland was enlarged.  C. He has no history of thyroiditis, abnormal thyroid tests, or known iodine exposure. He has not had any sushi, seaweed, iodine supplements, or iodinated imaging studies recently. He does not use much salt in cooking and does not usually put salt on his food.  D. He has been feeling unusually tired since May 22. In retrospect he felt run down in January 2021 when his wife tested positive for covid. He tested negative. He then felt pretty good until May. He has not been taking any new energy drinks, supplements, or new OTCs.  E. He has been under a lot of stress  at work.    2. Pertinent past medical history:  A. Medical problems:  1).He has paroxysmal A-fib occasionally. The episodes occur every 6-8 weeks. He takes a 30 mg Cardizem tablet when he feels A-fib. He takes 180 mg daily for treatment of hypertension. He says that he has normal sinus rhythm most days. He sees Dr. Verdis Prime. Andrew [name] had a normal ECHOcardiogram in December 2020. Exercise can trigger A-fib. He does not think that coffee is a trigger  2).  Hyperlipidemia   3). Hypertension   4). He often gets short of breath with exertion, but he is not sure if his A-fib is active at the time.   B. Surgeries: Cholecystectomy 2017, Vasectomy, nasal polyp removal  C. Allergies: No known medication allergies; no other allergies  D. GU: Adequate sexual unction  E. Medications: diltiazem, escitalopram, fluticasone NS, lorazepam,    3. Pertinent family history - No knowledge of father, except died of cancer, probably lung cancer.  A. Thyroid disease: None  B. DM: Mother diagnosed with T1DM at age 83. Maternal uncle died of T1DM.   C. ASHD: Maternal great grandmother died of a heart attack. Maternal grandfather had bilateral carotid surgeries.   D. Cancer: Father  E. Others: None  4. Pertinent Review of Systems:  Constitutional: The patient feels tired, but is otherwise well at rest.  Eyes: Vision is good. He has eyeglasses, but almost never uses them.  There are no significant eye complaints. Neck: The patient has no complaints of anterior neck swelling, soreness, tenderness,  pressure, discomfort, or difficulty swallowing.  Heart: As above. Heart rate increases with exercise or other physical activity. The patient has no complaints of palpitations, irregular heat beats, chest pain, or chest pressure. Gastrointestinal: Bowel movents seem normal, but occur about 3 times every morning after he first awakens. The patient has no complaints of excessive hunger, acid reflux, upset stomach,  stomach aches or pains. He had two colonoscopy. The first procedure in about 2017 removed several polyps. The second procedure in 2022 did not identify any polyps for removal. Hands and arms: No problems  Legs: Muscle mass and strength seem normal. There are no complaints of numbness, tingling, burning, or pain. No edema is noted. He often has pains in the back of his right knee which limit his range of flexion  Feet: He saw a podiatrist who gave him insoles. There are no  obvious foot problems. There are no complaints of numbness, tingling, burning, or pain. No edema is noted. GU: No problems  PAST MEDICAL, FAMILY, AND SOCIAL HISTORY:  Past Medical History:  Diagnosis Date   Anxiety    Depression    GERD (gastroesophageal reflux disease)     Family History  Problem Relation Age of Onset   Fainting Maternal Grandmother      Current Outpatient Medications:    diltiazem (CARDIZEM CD) 180 MG 24 hr capsule, Take 1 capsule (180 mg total) by mouth daily., Disp: 90 capsule, Rfl: 3   diltiazem (CARDIZEM) 30 MG tablet, Take 1 tablet every 4 hours AS NEEDED for AFIB heart rate >100, Disp: 45 tablet, Rfl: 1   escitalopram (LEXAPRO) 10 MG tablet, Take 10 mg by mouth daily., Disp: , Rfl:    fluticasone (FLONASE) 50 MCG/ACT nasal spray, Place 2 sprays into both nostrils daily., Disp: , Rfl:    LORazepam (ATIVAN) 0.5 MG tablet, Take 0.5 mg by mouth daily. , Disp: , Rfl:    Multiple Vitamin (MULTIVITAMIN WITH MINERALS) TABS tablet, Take 1 tablet by mouth daily., Disp: , Rfl:    CLENPIQ 10-3.5-12 MG-GM -GM/160ML SOLN, Take by mouth. (Patient not taking: Reported on 02/12/2021), Disp: , Rfl:    diclofenac (VOLTAREN) 75 MG EC tablet, Take 75 mg by mouth as needed.  (Patient not taking: Reported on 02/12/2021), Disp: , Rfl:   Allergies as of 02/12/2021   (No Known Allergies)    1. 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  2. Activities: He likes to garden, do photography, walk.  3. Smoking, alcohol, or drugs: He drinks 3-4 beers per night. No tobacco, no drugs 4. Primary Care Provider: [facility], [facility] Dr. Link SnufferHolwerda  REVIEW OF SYSTEMS: There are no other significant problems involving [name]'s other body systems.   Objective:  Vital Signs:  BP 110/70 (BP Location: Right Arm, Patient Position: Sitting, Cuff Size: Normal)   Pulse 74   Wt 174 lb 9.6 oz (79.2 kg)   BMI 25.05 kg/m    Ht Readings from Last 3  Encounters:  05/26/20 5\' 10"  (1.778 m)  02/23/20 5\' 10"  (1.778 m)  02/08/20 5\' 10"  (1.778 m)   Wt Readings from Last 3 Encounters:  02/12/21 174 lb 9.6 oz (79.2 kg)  05/26/20 173 lb 3.2 oz (78.6 kg)  02/23/20 171 lb 3.2 oz (77.7 kg)   HC Readings from Last 3 Encounters:  No data found for [place]   Body surface area is 1.98 meters squared.  Facility age limit for growth percentiles is 20 years. Facility age limit for growth percentiles is 20 years.   PHYSICAL EXAM:  Constitutional: The patient appears healthy and well nourished.  Face: The face appears normal.  Eyes: There is no obvious arcus or proptosis. Moisture appears normal. Mouth: The oropharynx and tongue appear normal. Oral moisture is normal. Neck: The neck appears to be visibly normal. No carotid  bruits are noted. The thyroid gland is top-normal size or just slightly enlarged at about 20+ grams in size. The lobes are symmetric in size. The consistency of the thyroid gland is normal. The thyroid gland is not tender to palpation. Lungs: The lungs are clear to auscultation. Air movement is good. Heart: Heart rate and rhythm are regular. Heart sounds S1 and S2 are normal. I did not appreciate any pathologic cardiac murmurs. Abdomen: The abdomen appears to be normal in size. Bowel sounds are normal. There is no obvious hepatomegaly, splenomegaly, or other mass effect.  Arms: Muscle size and bulk are normal for age. Hands: There is no obvious tremor. Phalangeal and metacarpophalangeal joints are normal. Palmar muscles are normal. Palmar skin is normal. Palmar moisture is also normal. Right radial pulse was about 58 per minute. Pulses were occasionally and irregularly irregular, favoring A-fib.  Legs: Muscles appear normal for age. No edema is present. Feet: Feet are normally formed. Dorsalis pedal pulses are normal. Neurologic: Strength is normal for age in both the upper and lower extremities. Muscle tone is normal. Sensation to  touch is normal in both the legs and feet.    LAB DATA:  Results for orders placed or performed in visit on 02/05/21 (from the past 504 hour(s))  T3, free   Collection Time: 02/06/21  1:39 PM  Result Value Ref Range   T3, Free 3.3 2.3 - 4.2 pg/mL  T4, free   Collection Time: 02/06/21  1:39 PM  Result Value Ref Range   Free T4 1.4 0.8 - 1.8 ng/dL  TSH   Collection Time: 02/06/21  1:39 PM  Result Value Ref Range   TSH 1.15 0.40 - 4.50 mIU/L  Thyroid peroxidase antibody   Collection Time: 02/06/21  1:39 PM  Result Value Ref Range   Thyroperoxidase Ab SerPl-aCnc 1 <9 IU/mL  Thyroglobulin antibody   Collection Time: 02/06/21  1:39 PM  Result Value Ref Range   Thyroglobulin Ab <1 < or = 1 IU/mL   Labs 02/06/21: TSH 1.15, free T4 1.4, free T3 3.3, TPO antibody <1, thyroglobulin antibody 1  Labs 01/19/21: TSH 12.98 (ref 0.58-5.50); Total cholesterol 190. Triglycerides 99, HDL 52, LDL 99; CRP 0.90 (ref 1.1-3.0); Glucose 93; BMD 1.014 g/cm2 - low probability of osteoporosis    Labs 01/10/20 at 5:45 PM: CBC normal; BMP normal, except glucose 104  Labs 07/10/2016: CBC normal; BMP normal  Assessment and Plan:   ASSESSMENT:  1. Abnormal thyroid test:  A. Marc's TSH was highly abnormal on 01/19/21, but his TSH, free T4, and free T3 were mid-euthyroid on 01/1421.  B. The differential diagnosis includes: Hashimoto's thyroiditis, nodular goiter with autonomy, or a false result.  C. We need to repeat his TFTs and obtain an US of the thyroid gland.   2. Fatigue: The fatigue has bothered him for about one month, but was probably present for longer. We need to check his CBC, CMP, and mononucleosis test.  3. Thyromegaly: His thyroid gland is minimally enlarged.  4. Atrial fib: This problem is managed by Dr. Garnette Scheuermann 5. Atherosclerotic disease: This problem is managed by Dr. Katrinka Blazing  PLAN:  1. Diagnostic: TFTs, CMP, CBC, mononucleosis test, thyroid US 2. Therapeutic: To be determined 3. Patient  education: We discussed all of the above at great length. 4. Follow-up: 3 months  Level of Service: This visit lasted in excess of 85 minutes. More than 50% of the visit was devoted to counseling.  Molli Knock, MD, CDE Adult  and Pediatric Endocrinology  At [facility], we are committed to providing exceptional care. You will receive a patient satisfaction survey through text or e-mail regarding your visit today. Your opinion is important to me. Comments are appreciated.

## 2021-02-12 NOTE — Telephone Encounter (Signed)
I called the patient, but he was not available.  I left a voicemail message that his previous thyroid tests were all normal. We have two options: He can come in about 3:50 PM today have blood drawn, and then have a visit with me. Or, I can order another set of thyroid tests to be done at his convenience in the next two weeks. We can then decide whether or not he needs to see me. I asked him to call me during the lunch hour this afternoon.  Molli Knock, MD, CDE

## 2021-03-12 ENCOUNTER — Ambulatory Visit
Admission: RE | Admit: 2021-03-12 | Discharge: 2021-03-12 | Disposition: A | Discharge status: Home / Self Care | Payer: 59 | Source: Ambulatory Visit | Attending: "Endocrinology | Admitting: "Endocrinology

## 2021-03-12 DIAGNOSIS — R7989 Other specified abnormal findings of blood chemistry: Secondary | ICD-10-CM

## 2021-03-13 ENCOUNTER — Encounter (INDEPENDENT_AMBULATORY_CARE_PROVIDER_SITE_OTHER): Payer: Self-pay | Admitting: *Deleted

## 2021-04-26 NOTE — Telephone Encounter (Signed)
Call was involuntarily aborted due to loss of contact with the server.  Molli Knock, MD

## 2021-06-12 NOTE — Progress Notes (Signed)
Cardiology Office Note:    Date:  06/13/2021   ID:  Andrew [name], DOB 01/01/1971, MRN [id number]  PCP:  [facility]  Cardiologist:  Lesleigh Noe, MD   Referring MD: [facility]*   Chief Complaint  Patient presents with   Atrial Fibrillation    History of Present Illness:    Andrew [name] is a 50 y.o. male with a hx of PAF (lasts up to 12 hours), aortic atherosclerosis, not anticoagulated (CHA2DS2-VASc score of 1).  Was seen in the A. fib clinic 02/08/20, drinks alcohol several times a week and caffeine daily, loud snoring, and bradycardia (50's), and stressful job.   He is doing okay.  He has been having episodes of atrial fibrillation with heart rates in the 150 beats per min quite frequently.  Episodes last 4 to 8 hours.  They cause him to feel drained, he has to lie down, and use rapid release diltiazem to become compensated.  Otherwise he is doing well.  Has not had syncope.  Past Medical History:  Diagnosis Date   Anxiety    Depression    GERD (gastroesophageal reflux disease)     Past Surgical History:  Procedure Laterality Date   CHOLECYSTECTOMY N/A 07/11/2016   Procedure: LAPAROSCOPIC CHOLECYSTECTOMY;  Surgeon: Abigail Miyamoto, MD;  Location: [facility] OR;  Service: General;  Laterality: N/A;   NASAL POLYP EXCISION     VASECTOMY      Current Medications: Current Meds  Medication Sig   apixaban (ELIQUIS) 5 MG TABS tablet Take 1 tablet (5 mg total) by mouth 2 (two) times daily.   diltiazem (CARDIZEM CD) 180 MG 24 hr capsule Take 1 capsule (180 mg total) by mouth daily.   diltiazem (CARDIZEM) 30 MG tablet Take 1 tablet every 4 hours AS NEEDED for AFIB heart rate >100   escitalopram (LEXAPRO) 10 MG tablet Take 10 mg by mouth daily.   flecainide (TAMBOCOR) 50 MG tablet Take 1 tablet (50 mg total) by mouth 2 (two) times daily.   fluticasone (FLONASE) 50 MCG/ACT nasal spray Place 2 sprays into both nostrils daily.   LORazepam  (ATIVAN) 0.5 MG tablet Take 0.5 mg by mouth 2 (two) times daily.   Multiple Vitamin (MULTIVITAMIN WITH MINERALS) TABS tablet Take 1 tablet by mouth daily.     Allergies:   Patient has no known allergies.   Social History   Socioeconomic History   Marital status: Married    Spouse name: Not on file   Number of children: Not on file   Years of education: Not on file   Highest education level: Not on file  Occupational History   Not on file  Tobacco Use   Smoking status: Former    Types: Cigarettes    Quit date: 07/11/2006    Years since quitting: 14.9   Smokeless tobacco: Never   Tobacco comments:    stopped 10 years ago  Substance and Sexual Activity   Alcohol use: Yes    Comment: occ   Drug use: No   Sexual activity: Not on file  Other Topics Concern   Not on file  Social History Narrative   Not on file   Social Determinants of Health   Financial Resource Strain: Not on file  Food Insecurity: Not on file  Transportation Needs: Not on file  Physical Activity: Not on file  Stress: Not on file  Social Connections: Not on file     Family History: The  patient's family history includes Fainting in his maternal grandmother.  ROS:   Please see the history of present illness.    Anxiety.  Quality of life is suffering because of episodes of prolonged atrial fibs.  All other systems reviewed and are negative.  EKGs/Labs/Other Studies Reviewed:    The following studies were reviewed today:  ECHOCARDIOGRAM 08/04/2019 IMPRESSIONS     1. Left ventricular ejection fraction, by 3D calculation, is 62%. The  left ventricle has normal function. There is no left ventricular  hypertrophy.   2. The left ventricle has no regional wall motion abnormalities.   3. Global right ventricle has normal systolic function.The right  ventricular size is normal. No increase in right ventricular wall  thickness.   4. Left atrial size was normal.   5. Right atrial size was normal.   6.  The mitral valve is normal in structure. Trivial mitral valve  regurgitation. No evidence of mitral stenosis.   7. The tricuspid valve is normal in structure. Tricuspid valve  regurgitation is trivial.   8. The aortic valve is tricuspid. Aortic valve regurgitation is trivial.  No evidence of aortic valve sclerosis or stenosis.   9. The pulmonic valve was normal in structure. Pulmonic valve  regurgitation is trivial.  10. Normal pulmonary artery systolic pressure.  11. The inferior vena cava is normal in size with greater than 50%  respiratory variability, suggesting right atrial pressure of 3 mmHg.  12. The average left ventricular global longitudinal strain is -19.6 %.   CARDIAC MONITOR 07/13/2020 Study Highlights  NSR with HR range 46-141 bpm and avg 61 bpm Rare brief SVT up to 17 beats. PAC and PVC burden each < 1% No atrial fibrillation.  EKG:  EKG sinus bradycardia at 54 bpm.  EKG is otherwise normal.  Recent Labs: 02/06/2021: TSH 1.15  Recent Lipid Panel No results found for: CHOL, TRIG, HDL, CHOLHDL, VLDL, LDLCALC, LDLDIRECT  Physical Exam:    VS:  BP 112/82   Pulse (!) 54   Ht 5\' 10"  (1.778 m)   Wt 178 lb (80.7 kg)   SpO2 97%   BMI 25.54 kg/m     Wt Readings from Last 3 Encounters:  06/13/21 178 lb (80.7 kg)  02/12/21 174 lb 9.6 oz (79.2 kg)  05/26/20 173 lb 3.2 oz (78.6 kg)     GEN: Healthy appearing. No acute distress HEENT: Normal NECK: No JVD. LYMPHATICS: No lymphadenopathy CARDIAC: No murmur. RRR no gallop, or edema. VASCULAR:  Normal Pulses. No bruits. RESPIRATORY:  Clear to auscultation without rales, wheezing or rhonchi  ABDOMEN: Soft, non-tender, non-distended, No pulsatile mass, MUSCULOSKELETAL: No deformity  SKIN: Warm and dry NEUROLOGIC:  Alert and oriented x 3 PSYCHIATRIC:  Normal affect   ASSESSMENT:    1. Paroxysmal atrial fibrillation (HCC)   2. Hyperlipidemia LDL goal <70   3. Other form of dyspnea   4. Snoring    PLAN:    In  order of problems listed above:  Increasing frequency of episodes that can last up to 8 hours and drained the patient of his energy.  Heart rates up to 160 bpm.  He at times has elevated blood pressure even on diltiazem.  He has aortic atherosclerosis noted on noncardiac imaging.  Start Eliquis based upon a Chads VASC score of 2.  EP consultation.  Contemplated starting flecainide or Multaq however relative bradycardia and medication interactions have led me to request consultation before starting therapy. We should consider starting statin therapy Did  not discuss and not currently a problem Needs to have a sleep study performed   Medication Adjustments/Labs and Tests Ordered: Current medicines are reviewed at length with the patient today.  Concerns regarding medicines are outlined above.  Orders Placed This Encounter  Procedures   EKG 12-Lead   Meds ordered this encounter  Medications   apixaban (ELIQUIS) 5 MG TABS tablet    Sig: Take 1 tablet (5 mg total) by mouth 2 (two) times daily.    Dispense:  60 tablet    Refill:  11   flecainide (TAMBOCOR) 50 MG tablet    Sig: Take 1 tablet (50 mg total) by mouth 2 (two) times daily.    Dispense:  60 tablet    Refill:  11    There are no Patient Instructions on file for this visit.   Signed, Lesleigh Noe, MD  06/13/2021 10:22 AM    [facility]

## 2021-06-13 ENCOUNTER — Ambulatory Visit (INDEPENDENT_AMBULATORY_CARE_PROVIDER_SITE_OTHER): Payer: 59 | Admitting: Interventional Cardiology

## 2021-06-13 ENCOUNTER — Other Ambulatory Visit: Payer: Self-pay

## 2021-06-13 ENCOUNTER — Encounter: Payer: Self-pay | Admitting: Interventional Cardiology

## 2021-06-13 VITALS — BP 112/82 | HR 54 | Ht 70.0 in | Wt 178.0 lb

## 2021-06-13 DIAGNOSIS — R0683 Snoring: Secondary | ICD-10-CM | POA: Diagnosis not present

## 2021-06-13 DIAGNOSIS — I700 Atherosclerosis of aorta: Secondary | ICD-10-CM

## 2021-06-13 DIAGNOSIS — E785 Hyperlipidemia, unspecified: Secondary | ICD-10-CM

## 2021-06-13 DIAGNOSIS — I480 Paroxysmal atrial fibrillation: Secondary | ICD-10-CM | POA: Diagnosis not present

## 2021-06-13 DIAGNOSIS — R0609 Other forms of dyspnea: Secondary | ICD-10-CM | POA: Diagnosis not present

## 2021-06-13 MED ORDER — APIXABAN 5 MG PO TABS: 5.0000 mg | ORAL_TABLET | Freq: Two times a day (BID) | ORAL | 11 refills | Status: DC

## 2021-06-13 MED ORDER — FLECAINIDE ACETATE 50 MG PO TABS: 50.0000 mg | ORAL_TABLET | Freq: Two times a day (BID) | ORAL | 11 refills | Status: DC

## 2021-06-13 NOTE — Patient Instructions (Signed)
Medication Instructions:  1) START Eliquis 5mg  twice daily  *If you need a refill on your cardiac medications before your next appointment, please call your pharmacy*   Lab Work: None If you have labs (blood work) drawn today and your tests are completely normal, you will receive your results only by: MyChart Message (if you have MyChart) OR A paper copy in the mail If you have any lab test that is abnormal or we need to change your treatment, we will call you to review the results.   Testing/Procedures: None   Follow-Up:  Your physician recommends that you schedule a follow-up appointment in: ASAP with our Electrophysiology team.   At [facility], you and your health needs are our priority.  As part of our continuing mission to provide you with exceptional heart care, we have created designated Provider Care Teams.  These Care Teams include your primary Cardiologist (physician) and Advanced Practice Providers (APPs -  Physician Assistants and Nurse Practitioners) who all work together to provide you with the care you need, when you need it.  We recommend signing up for the patient portal called "MyChart".  Sign up information is provided on this After Visit Summary.  MyChart is used to connect with patients for Virtual Visits (Telemedicine).  Patients are able to view lab/test results, encounter notes, upcoming appointments, etc.  Non-urgent messages can be sent to your provider as well.   To learn more about what you can do with MyChart, go to [facility].    Your next appointment:   6 month(s)  The format for your next appointment:   In Person  Provider:   You may see [email or web address], MD or one of the following Advanced Practice Providers on your designated Care Team:   Lesleigh Noe, NP   Other Instructions

## 2021-06-25 ENCOUNTER — Telehealth (INDEPENDENT_AMBULATORY_CARE_PROVIDER_SITE_OTHER): Payer: Self-pay | Admitting: "Endocrinology

## 2021-06-25 NOTE — Telephone Encounter (Signed)
Who's calling (name and relationship to patient) : Hillary Bray  wife Best contact number: [phone]  Provider they see: Adelina Mings   Reason for call: Please place lab orders for patient   Call ID:      PRESCRIPTION REFILL ONLY  Name of prescription:  Pharmacy:

## 2021-06-25 NOTE — Telephone Encounter (Signed)
Orders are already in

## 2021-07-09 NOTE — Progress Notes (Signed)
Subjective:  Patient Name: Andrew [name] Date of Birth: 01/22/1971  MRN: [id number]  Andrew [name]  presents to the office today, in referral from his wife, for initial endocrine consultation for the chief complaint of abnormal TSH on a Life Line screening exam.   HISTORY OF PRESENT ILLNESS:   Andrew [name] is a 50 y.o. Caucasian gentleman.  Andrew [name] was unaccompanied.   1. Andrew [name] had his initial endocrine consultation on 02/12/21:  A. When he had a Life Line screening on 01/19/21 he had the following lab results;   1). TSH 12.98 (ref 0.58-5.50)   2). Total cholesterol 190. Triglycerides 99, HDL 52, LDL 99   3). CRP 0.90 (ref 1.1-3.0)   4). BMD 1.014 g/cm2 - low probability of osteoporosis   5). Glucose 93   6). BP 133/52, BMI 25, waist circumference 33    7). Mild carotid artery disease  B. The technician performing his carotid study told him that his thyroid gland was enlarged.  C. He has no history of thyroiditis, abnormal thyroid tests, or known iodine exposure. He has not had any sushi, seaweed, iodine supplements, or iodinated imaging studies recently. He does not use much salt in cooking and does not usually put salt on his food.  D. He has been feeling unusually tired since May 22. In retrospect he felt run down in January 2021 when his wife tested positive for covid. He tested negative. He then felt pretty good until May. He has not been taking any new energy drinks, supplements, or new OTCs.  E. He has been under a lot of stress  at work.  F. Pertinent past medical history:   1). Medical problems:  a).He has paroxysmal A-fib occasionally. The episodes occur every 6-8 weeks. He takes a 30 mg Cardizem tablet when he feels A-fib. He takes 180 mg daily for treatment of hypertension. He says that he has normal sinus rhythm most days. He sees Dr. Verdis Prime. Andrew [name] had a normal ECHOcardiogram in December 2020. Exercise can trigger A-fib. He does not think that coffee is a trigger  b).  Hyperlipidemia    c). Hypertension    d). He often gets short of breath with exertion, but he is not sure if his A-fib is active at the time.   2). Surgeries: Cholecystectomy 2017, Vasectomy, nasal polyp removal   3). Allergies: No known medication allergies; no other allergies   4) GU: Adequate sexual libido and function   5). Medications: diltiazem, escitalopram, fluticasone NS, lorazepam  G. Pertinent family history - No knowledge of father, except died of cancer, probably lung cancer.   1). Thyroid disease: None   2). DM: Mother diagnosed with T1DM at age 61. Maternal uncle with T1DM died in a motorcycle accident.     3). ASHD: Maternal great grandmother died of a heart attack. Maternal grandfather had bilateral carotid surgeries.    4). Cancer: Father   5). Others: None  2. Andrew [name]'s last endocrine clinic visit occurred on 02/12/21. We were going to obtain lab tests that day, but did not.    A. In the interim he had been feeling better until yesterday when he began feeling more tired. He felt so tired that he did not want to come in for today's visit. He has also had some feelings of having difficulty breathing and taking ina full breath. He is not aware of feeling anxious, but has a great deal of work stress. . B. He is having "bad" episodes of paroxysmal a-fib about  once month.  His HRs are about 155-200 then. He saw Dr. Verdis Prime on 06/13/21. Dr. Katrinka Blazing started Tony on Eliquis, 5 mg, twice daily. Dr. Katrinka Blazing referred him to the Electrophysiology Team. Andrew [name] will see then on the day before Thanksgiving.  C. He still has a lot of snoring. Dr. Katrinka Blazing recommended that Andrew [name] have asleep study.  D. He takes diltiazem, Eliquis, escitalopram. E. He wonders if he has hypoglycemia.  1). He has been having spells since the age of 35 or 36. He often feels weak, he gets confused, his hands get shaky, he has to sit down. He doesn't feel dizzy per se, but does feel unstable. He eats then. He feels better about  30 minutes later.  2). In the past he had more episodes in the mornings if he only had toast at breakfast. Now since he began having more protein at breakfast, he does not usually have episodes in the mornings.  3). His current episodes now occur mostly in the afternoons about 3-5 PM. These episodes often occur if he has not eaten enough at lunch or didn't eat lunch. He doesn't think that his physical activity is associated with these episodes.   4. Pertinent Review of Systems:  Constitutional: The patient feels tired today, but not hypoglycemic.  Eyes: Vision is good. He has eyeglasses, but almost never uses them.  There are no significant eye complaints. Neck: The patient has no complaints of anterior neck swelling, soreness, tenderness,  pressure, discomfort, or difficulty swallowing.  Heart: As above. Heart rate increases with exercise or other physical activity. The patient has no complaints of palpitations, irregular heat beats, chest pain, or chest pressure. Gastrointestinal: he had heartburn and burping first thing in the morning several days last week. He was drinking a different beer at that time. Bowel movents seem normal, but occur about 3 times every morning after he first awakens. The patient has no complaints of excessive hunger, acid reflux, upset stomach, stomach aches or pains. He had two colonoscopies. The first procedure in about 2017 removed several polyps. The second procedure in 2022 did not identify any polyps for removal. Hands and arms: He occasionally has arm pains after physical work. Legs: Muscle mass and strength seem normal. There are no complaints of numbness, tingling, burning, or pain. No edema is noted. He often has pains in the back of his right knee after walking. These pains can limit his range of flexion  Feet: He saw a podiatrist who gave him insoles. There are no obvious foot problems. There are no complaints of numbness, tingling, burning, or pain. No edema is  noted. GU: No problems  PAST MEDICAL, FAMILY, AND SOCIAL HISTORY:  Past Medical History:  Diagnosis Date   Anxiety    Depression    GERD (gastroesophageal reflux disease)     Family History  Problem Relation Age of Onset   Fainting Maternal Grandmother      Current Outpatient Medications:    apixaban (ELIQUIS) 5 MG TABS tablet, Take 1 tablet (5 mg total) by mouth 2 (two) times daily., Disp: 60 tablet, Rfl: 11   diltiazem (CARDIZEM CD) 180 MG 24 hr capsule, Take 1 capsule (180 mg total) by mouth daily., Disp: 90 capsule, Rfl: 3   diltiazem (CARDIZEM) 30 MG tablet, Take 1 tablet every 4 hours AS NEEDED for AFIB heart rate >100, Disp: 45 tablet, Rfl: 1   escitalopram (LEXAPRO) 10 MG tablet, Take 10 mg by mouth daily., Disp: , Rfl:  fluticasone (FLONASE) 50 MCG/ACT nasal spray, Place 2 sprays into both nostrils daily., Disp: , Rfl:    LORazepam (ATIVAN) 0.5 MG tablet, Take 0.5 mg by mouth 2 (two) times daily., Disp: , Rfl:    Multiple Vitamin (MULTIVITAMIN WITH MINERALS) TABS tablet, Take 1 tablet by mouth daily., Disp: , Rfl:    CLENPIQ 10-3.5-12 MG-GM -GM/160ML SOLN, Take by mouth. (Patient not taking: No sig reported), Disp: , Rfl:    diclofenac (VOLTAREN) 75 MG EC tablet, Take 75 mg by mouth as needed.  (Patient not taking: No sig reported), Disp: , Rfl:   Allergies as of 07/10/2021   (No Known Allergies)    1. 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  2. Activities: He likes to garden, do photography, walk.  3. Smoking, alcohol, or drugs: He drinks 3-4 beers per night. No tobacco, no drugs 4. Primary Care Provider: Pa, [facility] Dr. Link Snuffer  REVIEW OF SYSTEMS: There are no other significant problems involving Frank's other body systems.   Objective:  Vital Signs:  BP 118/72 (BP Location: Right Arm, Patient Position: Sitting, Cuff Size: Normal)   Pulse (!) 56   Wt 176 lb 9.6 oz  (80.1 kg)   BMI 25.34 kg/m    Ht Readings from Last 3 Encounters:  06/13/21 5\' 10"  (1.778 m)  05/26/20 5\' 10"  (1.778 m)  02/23/20 5\' 10"  (1.778 m)   Wt Readings from Last 3 Encounters:  07/10/21 176 lb 9.6 oz (80.1 kg)  06/13/21 178 lb (80.7 kg)  02/12/21 174 lb 9.6 oz (79.2 kg)   HC Readings from Last 3 Encounters:  No data found for [facility]   Body surface area is 1.99 meters squared.  Facility age limit for growth percentiles is 20 years. Facility age limit for growth percentiles is 20 years.   PHYSICAL EXAM:  Constitutional: The patient appears healthy and well nourished. He has gained 2 pounds since his last visit. He is alert and bright, but his affect seems a little low. His insight is normal. He has a-fib with HRs of 52-56 beats per minute.  Face: The face appears normal.  Eyes: There is no obvious arcus or proptosis. Moisture appears normal. Mouth: The oropharynx appears normal. He has 1+ tongue tremor. Oral moisture is normal. Neck: The neck appears to be visibly normal. No carotid bruits are noted. The thyroid gland is slightly smaller today at the top-normal size of 20 grams in size. The consistency of the thyroid gland is normal. The thyroid gland is not tender to palpation. Lungs: The lungs are clear to auscultation. Air movement is good. Heart: Heart rate and rhythm are regular. Heart sounds S1 and S2 are normal. I did not appreciate any pathologic cardiac murmurs. Abdomen: The abdomen appears to be normal in size. Bowel sounds are normal. There is no obvious hepatomegaly, splenomegaly, or other mass effect.  Arms: Muscle size and bulk are normal for age. Hands: There is no obvious tremor. Phalangeal and metacarpophalangeal joints are normal. Palmar muscles are normal. Palmar skin is normal. Palmar moisture is also normal. Right radial pulse was about 58 per minute. Pulses were irregularly irregular, favoring A-fib.  Legs: Muscles appear normal for age. No edema is  present. Neurologic: Strength is normal for age in both the upper and lower extremities. Muscle tone is normal. Sensation to touch is normal in both legs.    LAB DATA:  No results  found for this or any previous visit (from the past 504 hour(s)).  Labs 07/10/21: pending  Labs 02/06/21: TSH 1.15, free T4 1.4, free T3 3.3, TPO antibody <1, thyroglobulin antibody 1  Labs 01/19/21: TSH 12.98 (ref 0.58-5.50); Total cholesterol 190. Triglycerides 99, HDL 52, LDL 99; CRP 0.90 (ref 1.1-3.0); Glucose 93; BMD 1.014 g/cm2 - low probability of osteoporosis    Labs 01/10/20 at 5:45 PM: CBC normal; BMP normal, except glucose 104  Labs 07/10/2016: CBC normal; BMP normal  IMAGING  US thyroid 03/12/21: The right lobe is 4.4 x 1.7 cm. The left lobe is 5.0 x 2.9 cm.  The parenchyma is mildly heterogenous. He had one 1.3 cm, isoechoic, solid nodule in the left inferior lobe. That nodule does not meet the ACR  TI-RADS criteria for biopsy or further imaging. He also has a sub-centimeter nodule in the left superior pole that was noted incidentally and does not meet criteria for biopsy or further imaging.   Assessment and Plan:   ASSESSMENT:  1. Abnormal thyroid test:  A. Andrew [name]'s TSH was highly abnormal on 01/19/21, but his TSH, free T4, and free T3 were mid-euthyroid on 01/1421.  B. The differential diagnosis includes: Hashimoto's thyroiditis, nodular goiter with autonomy, or a false result.  C. Given the family history of what sounds like autoimmune T1DM in his mother and maternal uncle, it is certainly possible that Andrew [name] could have evolving Hashimoto's disease. The heterogenous nature of his thyroid parenchyma also suggests Hashimoto's disease. We need to repeat his TFTs today.    2. Thyromegaly:  A. His thyroid gland was minimally enlarged in July 2022. His thyroid US in July 2022 showed that both lobes were enlarged. The parenchyma was noted to be "mildly heterogenous", which is c/w Hashimoto's disease.   B. His  thyroid gland has shrunk back to top-normal size in November 2022. Waxing and waning of thyroid gland size and lobe size is also c/w Hashimoto's thyroiditis. 3. Fatigue:  A. The fatigue had bothered him for about one month prior to his last visit, but was probably present longer. B. After his last visit the fatigue resolved, but came back a few days ago.  Since he did not have his lab tests done after his last visit, we will do them today.  4. Atrial fib: This problem is managed by Dr. Garnette Scheuermann. Since the problem seems to be worsening, Dr. Katrinka Blazing started Andrew [name] on Eliquis and is referring Andrew [name] to the electrophysiologist.  5. Atherosclerotic disease: This problem is managed by Dr. Katrinka Blazing 6. Weakness, confusion, lightheaded, unstable spells: These spells could be due to hypoglycemia, to hypoadrenalism, to a-fib causing hypoperfusion, or to other causes. He could be developing LADA. We will review his lab results to see what clues might be present.  PLAN:  1. Diagnostic: TFTs, CMP, CBC, mononucleosis test, ACTH, cortisol today 2. Therapeutic: To be determined 3. Patient education: We discussed all of the above at great length. 4. Follow-up: 3 months  Level of Service: This visit lasted in excess of 80 minutes. More than 50% of the visit was devoted to counseling.  Molli Knock, MD, CDE Adult and Pediatric Endocrinology

## 2021-07-10 ENCOUNTER — Encounter (INDEPENDENT_AMBULATORY_CARE_PROVIDER_SITE_OTHER): Payer: Self-pay | Admitting: "Endocrinology

## 2021-07-10 ENCOUNTER — Ambulatory Visit (INDEPENDENT_AMBULATORY_CARE_PROVIDER_SITE_OTHER): Payer: 59 | Admitting: "Endocrinology

## 2021-07-10 ENCOUNTER — Other Ambulatory Visit: Payer: Self-pay

## 2021-07-10 VITALS — BP 118/72 | HR 56 | Wt 176.6 lb

## 2021-07-10 DIAGNOSIS — R7989 Other specified abnormal findings of blood chemistry: Secondary | ICD-10-CM

## 2021-07-10 DIAGNOSIS — R251 Tremor, unspecified: Secondary | ICD-10-CM

## 2021-07-10 DIAGNOSIS — E010 Iodine-deficiency related diffuse (endemic) goiter: Secondary | ICD-10-CM

## 2021-07-10 DIAGNOSIS — R5383 Other fatigue: Secondary | ICD-10-CM

## 2021-07-10 DIAGNOSIS — R531 Weakness: Secondary | ICD-10-CM | POA: Insufficient documentation

## 2021-07-10 DIAGNOSIS — I480 Paroxysmal atrial fibrillation: Secondary | ICD-10-CM

## 2021-07-10 NOTE — Patient Instructions (Addendum)
Follow up visit in 3 months.   At [facility], we are committed to providing exceptional care. You will receive a patient satisfaction survey through text or email regarding your visit today. Your opinion is important to me. Comments are appreciated.   

## 2021-07-14 LAB — COMPREHENSIVE METABOLIC PANEL
AG Ratio: 1.7 (calc) (ref 1.0–2.5)
ALT: 22 U/L (ref 9–46)
AST: 19 U/L (ref 10–35)
Albumin: 4.5 g/dL (ref 3.6–5.1)
Alkaline phosphatase (APISO): 50 U/L (ref 35–144)
BUN: 12 mg/dL (ref 7–25)
CO2: 28 mmol/L (ref 20–32)
Calcium: 9.5 mg/dL (ref 8.6–10.3)
Chloride: 104 mmol/L (ref 98–110)
Creat: 0.85 mg/dL (ref 0.70–1.30)
Globulin: 2.6 g/dL (calc) (ref 1.9–3.7)
Glucose, Bld: 94 mg/dL (ref 65–139)
Potassium: 4.2 mmol/L (ref 3.5–5.3)
Sodium: 139 mmol/L (ref 135–146)
Total Bilirubin: 1 mg/dL (ref 0.2–1.2)
Total Protein: 7.1 g/dL (ref 6.1–8.1)

## 2021-07-14 LAB — C-PEPTIDE: C-Peptide: 1 ng/mL (ref 0.80–3.85)

## 2021-07-14 LAB — CBC WITH DIFFERENTIAL/PLATELET
Absolute Monocytes: 402 {cells}/uL (ref 200–950)
Basophils Absolute: 72 {cells}/uL (ref 0–200)
Basophils Relative: 1.2 %
Eosinophils Absolute: 162 {cells}/uL (ref 15–500)
Eosinophils Relative: 2.7 %
HCT: 45.9 % (ref 38.5–50.0)
Hemoglobin: 16.1 g/dL (ref 13.2–17.1)
Lymphs Abs: 2412 {cells}/uL (ref 850–3900)
MCH: 34.6 pg — ABNORMAL HIGH (ref 27.0–33.0)
MCHC: 35.1 g/dL (ref 32.0–36.0)
MCV: 98.7 fL (ref 80.0–100.0)
MPV: 11.4 fL (ref 7.5–12.5)
Monocytes Relative: 6.7 %
Neutro Abs: 2952 {cells}/uL (ref 1500–7800)
Neutrophils Relative %: 49.2 %
Platelets: 232 10*3/uL (ref 140–400)
RBC: 4.65 Million/uL (ref 4.20–5.80)
RDW: 11.8 % (ref 11.0–15.0)
Total Lymphocyte: 40.2 %
WBC: 6 10*3/uL (ref 3.8–10.8)

## 2021-07-14 LAB — IRON: Iron: 223 ug/dL — ABNORMAL HIGH (ref 50–180)

## 2021-07-14 LAB — ACTH: C206 ACTH: 18 pg/mL (ref 6–50)

## 2021-07-14 LAB — T3, FREE: T3, Free: 3.3 pg/mL (ref 2.3–4.2)

## 2021-07-14 LAB — TSH: TSH: 1.17 mIU/L (ref 0.40–4.50)

## 2021-07-14 LAB — HEMOGLOBIN A1C
Hgb A1c MFr Bld: 4.5 % of total Hgb (ref <5.7)
Mean Plasma Glucose: 82 mg/dL
eAG (mmol/L): 4.6 mmol/L

## 2021-07-14 LAB — CORTISOL: Cortisol, Plasma: 5.9 ug/dL

## 2021-07-14 LAB — T4, FREE: Free T4: 1.3 ng/dL (ref 0.8–1.8)

## 2021-07-14 LAB — MONONUCLEOSIS SCREEN: Heterophile, Mono Screen: NEGATIVE

## 2021-07-16 ENCOUNTER — Telehealth (INDEPENDENT_AMBULATORY_CARE_PROVIDER_SITE_OTHER): Payer: Self-pay | Admitting: "Endocrinology

## 2021-07-16 ENCOUNTER — Encounter: Payer: Self-pay | Admitting: Cardiology

## 2021-07-16 DIAGNOSIS — R718 Other abnormality of red blood cells: Secondary | ICD-10-CM

## 2021-07-16 NOTE — Telephone Encounter (Signed)
Hillary called. She and [name] looked at his recent labs from 07/10/21 and have concerns about his iron and MCH.  His HbA1c, TFTs, CM, C-peptide, ACTH, and cortisol were good. His monospot was negative. His iron was elevated at 223 (ref 50-180) His CBC was normal, except for a MCH of 34.6 (ref 27-33).  He has been feeling better than he did on 07/10/21. He feels more energetic, still a little bit tired, but pretty much back to normal. He is taking an MVI. They do not use any cast iron pans. They have never had their well water tested. He has never donated blood. He has had oysters frequently in the past several weeks and crab cakes. He has high protein oatmeal that has iron for breakfast. He eats Healthy Choice meals for lunch. They almost always eat fish or other sea food for dinner. for dinner. They do not eat much red meat and have not had any beans recently or spinach recently. .  I asked him to look at the product labels of everything he eats and drinks. I asked him to eliminate all sources of iron, to include his MVI.  Re-test CBC and iron in two weeks and four weeks. Consider having their well water tested.  Molli Knock, MD, CDE

## 2021-07-18 ENCOUNTER — Other Ambulatory Visit: Payer: Self-pay

## 2021-07-18 ENCOUNTER — Ambulatory Visit (INDEPENDENT_AMBULATORY_CARE_PROVIDER_SITE_OTHER): Payer: 59 | Admitting: Cardiology

## 2021-07-18 ENCOUNTER — Encounter: Payer: Self-pay | Admitting: Cardiology

## 2021-07-18 VITALS — BP 130/84 | HR 49 | Ht 70.0 in | Wt 177.8 lb

## 2021-07-18 DIAGNOSIS — I480 Paroxysmal atrial fibrillation: Secondary | ICD-10-CM | POA: Diagnosis not present

## 2021-07-18 DIAGNOSIS — Z01818 Encounter for other preprocedural examination: Secondary | ICD-10-CM | POA: Diagnosis not present

## 2021-07-18 DIAGNOSIS — Z01812 Encounter for preprocedural laboratory examination: Secondary | ICD-10-CM

## 2021-07-18 NOTE — Progress Notes (Signed)
Electrophysiology Office Note   Date:  07/18/2021   ID:  [name], DOB 1971-06-20, MRN [id number]  PCP:  [facility]  Cardiologist:  Katrinka Blazing Primary Electrophysiologist:  Lazar Tierce [name], MD    Chief Complaint: AF   History of Present Illness: Andrew [name] is a 50 y.o. male who is being seen today for the evaluation of AF at the request of Lyn Records, MD. Presenting today for electrophysiology evaluation.  She has a history significant for atrial fibrillation, aortic sclerosis.  She has been having intermittent episodes of atrial fibrillation with heart rates in the 150s.  Episodes last between 4 to 8 hours.  These episodes make him feel tired and fatigued.  He has to lie down.  He uses as needed diltiazem to compensate.  Since being diagnosed with atrial fibrillation, he has had quite a bit of anxiety.  He feels quite bad in atrial fibrillation, and has changed his daily activities to try and stay in normal rhythm.  He has not been exercising other than walking.  He also feels like he is having COVID atrial fibrillation if he bends down  Today, he denies symptoms of palpitations, chest pain, shortness of breath, orthopnea, PND, lower extremity edema, claudication, dizziness, presyncope, syncope, bleeding, or neurologic sequela. The patient is tolerating medications without difficulties.    Past Medical History:  Diagnosis Date   Anxiety    Depression    GERD (gastroesophageal reflux disease)    Past Surgical History:  Procedure Laterality Date   CHOLECYSTECTOMY N/A 07/11/2016   Procedure: LAPAROSCOPIC CHOLECYSTECTOMY;  Surgeon: Abigail Miyamoto, MD;  Location: [facility] OR;  Service: General;  Laterality: N/A;   NASAL POLYP EXCISION     VASECTOMY       Current Outpatient Medications  Medication Sig Dispense Refill   apixaban (ELIQUIS) 5 MG TABS tablet Take 1 tablet (5 mg total) by mouth 2 (two) times daily. 60 tablet 11   diltiazem (CARDIZEM  CD) 180 MG 24 hr capsule Take 1 capsule (180 mg total) by mouth daily. 90 capsule 3   diltiazem (CARDIZEM) 30 MG tablet Take 1 tablet every 4 hours AS NEEDED for AFIB heart rate >100 45 tablet 1   escitalopram (LEXAPRO) 10 MG tablet Take 10 mg by mouth daily.     fluticasone (FLONASE) 50 MCG/ACT nasal spray Place 2 sprays into both nostrils daily.     LORazepam (ATIVAN) 0.5 MG tablet Take 0.5 mg by mouth 2 (two) times daily.     Multiple Vitamin (MULTIVITAMIN WITH MINERALS) TABS tablet Take 1 tablet by mouth daily.     CLENPIQ 10-3.5-12 MG-GM -GM/160ML SOLN Take by mouth. (Patient not taking: Reported on 02/12/2021)     diclofenac (VOLTAREN) 75 MG EC tablet Take 75 mg by mouth as needed.  (Patient not taking: Reported on 02/12/2021)     No current facility-administered medications for this visit.    Allergies:   Patient has no known allergies.   Social History:  The patient  reports that he quit smoking about 15 years ago. His smoking use included cigarettes. He has never used smokeless tobacco. He reports current alcohol use. He reports that he does not use drugs.   Family History:  The patient's family history includes Fainting in his maternal grandmother.    ROS:  Please see the history of present illness.   Otherwise, review of systems is positive for none.   All other systems are reviewed and negative.  PHYSICAL EXAM: VS:  BP 130/84   Pulse (!) 49   Ht 5\' 10"  (1.778 m)   Wt 177 lb 12.8 oz (80.6 kg)   SpO2 97%   BMI 25.51 kg/m  , BMI Body mass index is 25.51 kg/m. GEN: Well nourished, well developed, in no acute distress  HEENT: normal  Neck: no JVD, carotid bruits, or masses Cardiac: RRR; no murmurs, rubs, or gallops,no edema  Respiratory:  clear to auscultation bilaterally, normal work of breathing GI: soft, nontender, nondistended, + BS MS: no deformity or atrophy  Skin: warm and dry Neuro:  Strength and sensation are intact Psych: euthymic mood, full affect  EKG:   EKG is ordered today. Personal review of the ekg ordered shows sinus rhythm, rate 49  Recent Labs: 07/10/2021: ALT 22; BUN 12; Creat 0.85; Hemoglobin 16.1; Platelets 232; Potassium 4.2; Sodium 139; TSH 1.17    Lipid Panel  No results found for: CHOL, TRIG, HDL, CHOLHDL, VLDL, LDLCALC, LDLDIRECT   Wt Readings from Last 3 Encounters:  07/18/21 177 lb 12.8 oz (80.6 kg)  07/10/21 176 lb 9.6 oz (80.1 kg)  06/13/21 178 lb (80.7 kg)      Other studies Reviewed: Additional studies/ records that were reviewed today include: TTE 07/29/19  Review of the above records today demonstrates:   1. Left ventricular ejection fraction, by 3D calculation, is 62%. The  left ventricle has normal function. There is no left ventricular  hypertrophy.   2. The left ventricle has no regional wall motion abnormalities.   3. Global right ventricle has normal systolic function.The right  ventricular size is normal. No increase in right ventricular wall  thickness.   4. Left atrial size was normal.   5. Right atrial size was normal.   6. The mitral valve is normal in structure. Trivial mitral valve  regurgitation. No evidence of mitral stenosis.   7. The tricuspid valve is normal in structure. Tricuspid valve  regurgitation is trivial.   8. The aortic valve is tricuspid. Aortic valve regurgitation is trivial.  No evidence of aortic valve sclerosis or stenosis.   9. The pulmonic valve was normal in structure. Pulmonic valve  regurgitation is trivial.  10. Normal pulmonary artery systolic pressure.  11. The inferior vena cava is normal in size with greater than 50%  respiratory variability, suggesting right atrial pressure of 3 mmHg.  12. The average left ventricular global longitudinal strain is -19.6 %.    ASSESSMENT AND PLAN:  1.  Paroxysmal atrial fibrillation: CHA2DS2-VASc of 2.  Currently on Eliquis 5 mg twice daily.  He would like to try and avoid medications.  He has quite a bit of anxiety  associated with his atrial fibrillation.  He would like to stay in normal rhythm that, we [name] plan for ablation.  Risk, benefits, and alternatives to EP study and radiofrequency ablation for afib were also discussed in detail today. These risks include but are not limited to stroke, bleeding, vascular damage, tamponade, perforation, damage to the esophagus, lungs, and other structures, pulmonary vein stenosis, worsening renal function, and death. The patient understands these risk and wishes to proceed.  We Gene Colee therefore proceed with catheter ablation at the next available time.  Carto, ICE, anesthesia are requested for the procedure.  Pattricia Weiher also obtain CT PV protocol prior to the procedure to exclude LAA thrombus and further evaluate atrial anatomy.   2.  Hyperlipidemia: Goal LDL less than 70.  Management per primary cardiology.  3.  Snoring: Sleep  study pending.  Case discussed with primary cardiology  Current medicines are reviewed at length with the patient today.   The patient does not have concerns regarding his medicines.  The following changes were made today:  none  Labs/ tests ordered today include:  Orders Placed This Encounter  Procedures   CT CARDIAC MORPH/PULM VEIN W/CM&W/O CA SCORE   Basic metabolic panel   CBC   EKG 12-Lead     Disposition:   FU with Damico Partin 3 months  Signed, Cesareo Vickrey [name], MD  07/18/2021 10:50 AM     [facility] [address] [place] [address] [phone] (office) [phone] (fax)

## 2021-07-18 NOTE — Patient Instructions (Signed)
Medication Instructions:  Your physician recommends that you continue on your current medications as directed. Please refer to the Current Medication list given to you today.  *If you need a refill on your cardiac medications before your next appointment, please call your pharmacy*   Lab Work: Pre procedure labs 08/29/21:  BMP & CBC  If you have labs (blood work) drawn today and your tests are completely normal, you will receive your results only by: MyChart Message (if you have MyChart) OR A paper copy in the mail If you have any lab test that is abnormal or we need to change your treatment, we will call you to review the results.   Testing/Procedures: Your physician has requested that you have cardiac CT within 7 days PRIOR to your ablation. Cardiac computed tomography (CT) is a painless test that uses an x-ray machine to take clear, detailed pictures of your heart.  Please follow instruction below located under "other instructions". You will get a call from our office to schedule the date for this test.  Your physician has recommended that you have an ablation. Catheter ablation is a medical procedure used to treat some cardiac arrhythmias (irregular heartbeats). During catheter ablation, a long, thin, flexible tube is put into a blood vessel in your groin (upper thigh), or neck. This tube is called an ablation catheter. It is then guided to your heart through the blood vessel. Radio frequency waves destroy small areas of heart tissue where abnormal heartbeats may cause an arrhythmia to start. Please follow instruction below located under "other instructions".   Follow-Up: At [facility], you and your health needs are our priority.  As part of our continuing mission to provide you with exceptional heart care, we have created designated Provider Care Teams.  These Care Teams include your primary Cardiologist (physician) and Advanced Practice Providers (APPs -  Physician Assistants and Nurse  Practitioners) who all work together to provide you with the care you need, when you need it.  Your next appointment:   1 month(s) after your ablation  The format for your next appointment:   In Person  Provider:   AFib clinic   Thank you for choosing [facility]!!   Dory Horn, RN [phone]    Other Instructions  CT INSTRUCTIONS Your cardiac CT will be scheduled at:  [facility] [address] [place], [place] [address] [phone]  Please arrive at the [facility] main entrance of [facility] 30 minutes prior to test start time. Proceed to the [facility] Radiology Department (first floor) to check-in and test prep.   Please follow these instructions carefully (unless otherwise directed):  Hold all erectile dysfunction medications at least 3 days (72 hrs) prior to test.  On the Night Before the Test: Be sure to Drink plenty of water. Do not consume any caffeinated/decaffeinated beverages or chocolate 12 hours prior to your test. Do not take any antihistamines 12 hours prior to your test.  On the Day of the Test: Drink plenty of water until 1 hour prior to the test. Do not eat any food 4 hours prior to the test. You may take your regular medications prior to the test.  HOLD Furosemide/Hydrochlorothiazide morning of the test.      After the Test: Drink plenty of water. After receiving IV contrast, you may experience a mild flushed feeling. This is normal. On occasion, you may experience a mild rash up to 24 hours after the test. This is not  dangerous. If this occurs, you can take Benadryl 25 mg and increase your fluid intake. If you experience trouble breathing, this can be serious. If it is severe call 911 IMMEDIATELY. If it is mild, please call our office. If you take any of these medications: Glipizide/Metformin, Avandament, Glucavance, please do not take 48 hours after completing test unless otherwise instructed.   Once we  have confirmed authorization from your insurance company, we will call you to set up a date and time for your test. Based on how quickly your insurance processes prior authorizations requests, please allow up to 4 weeks to be contacted for scheduling your Cardiac CT appointment. Be advised that routine Cardiac CT appointments could be scheduled as many as 8 weeks after your provider has ordered it.  For non-scheduling related questions, please contact the cardiac imaging nurse navigator should you have any questions/concerns: Rockwell Alexandria, Cardiac Imaging Nurse Navigator Larey Brick, Cardiac Imaging Nurse Navigator [facility] Direct Office Dial: [phone]   For scheduling needs, including cancellations and rescheduling, please call [place], [phone].      Electrophysiology/Ablation Procedure Instructions   You are scheduled for a(n)  ablation on 09/12/2021 with Dr. Loman Brooklyn.   1.   Pre procedure testing-             A.  LAB WORK --- On 08/29/2021 for your pre procedure blood work.  You do NOT need to be fasting.  You can stop by the office anytime between 7:30 am - 4:30 pm.    On the day of your procedure 09/12/2021 you will go to [facility] hospital ([address]) at 9:30 am.  Bonita Quin will go to the main entrance [employer]) and enter where the [employer] are.  Your driver will drop you off and you will head down the hallway to ADMITTING.  You may have one support person come in to the hospital with you.  They will be asked to wait in the waiting room. It is OK to have someone drop you off and come back when you are ready to be discharged.   3.   Do not eat or drink after midnight prior to your procedure.   4.   On the morning of your procedure do NOT take any medication. Do not miss any doses of your blood thinner prior to the morning of your procedure or your procedure will need to be rescheduled.   5.  Plan for an overnight stay but  you may be discharged after your procedure, if you use your phone frequently bring your phone charger. If you are discharged after your procedure you will need someone to drive you home and be with you for 24 hours after your procedure.   6. You will follow up with the AFIB clinic 4 weeks after your procedure.  You will follow up with Dr. Elberta Fortis  3 months after your procedure.  These appointments will be made for you.   7. FYI: For your safety, and to allow [place] to monitor your vital signs accurately during the surgery/procedure we request that if you have artificial nails, gel coating, SNS etc. Please have those removed prior to your surgery/procedure. Not having the nail coverings /polish removed may result in cancellation or delay of your surgery/procedure.  * If you have ANY questions please call the office [phone] and ask for Marika Mahaffy RN or send me a MyChart message   * Occasionally, EP Studies and  ablations can become lengthy.  Please make your family aware of this before your procedure starts.  Average time ranges from 2-8 hours for EP studies/ablations.  Your physician will call your family after the procedure with the results.                                   Cardiac Ablation Cardiac ablation is a procedure to destroy (ablate) some heart tissue that is sending bad signals. These bad signals cause problems in heart rhythm. The heart has many areas that make these signals. If there are problems in these areas, they can make the heart beat in a way that is not normal. Destroying some tissues can help make the heart rhythm normal. Tell your doctor about: Any allergies you have. All medicines you are taking. These include vitamins, herbs, eye drops, creams, and over-the-counter medicines. Any problems you or family members have had with medicines that make you fall asleep (anesthetics). Any blood disorders you have. Any surgeries you have had. Any medical conditions you have, such as  kidney failure. Whether you are pregnant or may be pregnant. What are the risks? This is a safe procedure. But problems may occur, including: Infection. Bruising and bleeding. Bleeding into the chest. Stroke or blood clots. Damage to nearby areas of your body. Allergies to medicines or dyes. The need for a pacemaker if the normal system is damaged. Failure of the procedure to treat the problem. What happens before the procedure? Medicines Ask your doctor about: Changing or stopping your normal medicines. This is important. Taking aspirin and ibuprofen. Do not take these medicines unless your doctor tells you to take them. Taking other medicines, vitamins, herbs, and supplements. General instructions Follow instructions from your doctor about what you cannot eat or drink. Plan to have someone take you home from the hospital or clinic. If you will be going home right after the procedure, plan to have someone with you for 24 hours. Ask your doctor what steps will be taken to prevent infection. What happens during the procedure?  An IV tube will be put into one of your veins. You will be given a medicine to help you relax. The skin on your neck or groin will be numbed. A cut (incision) will be made in your neck or groin. A needle will be put through your cut and into a large vein. A tube (catheter) will be put into the needle. The tube will be moved to your heart. Dye may be put through the tube. This helps your doctor see your heart. Small devices (electrodes) on the tube will send out signals. A type of energy will be used to destroy some heart tissue. The tube will be taken out. Pressure will be held on your cut. This helps stop bleeding. A bandage will be put over your cut. The exact procedure may vary among doctors and hospitals. What happens after the procedure? You will be watched until you leave the hospital or clinic. This includes checking your heart rate, breathing rate,  oxygen, and blood pressure. Your cut will be watched for bleeding. You will need to lie still for a few hours. Do not drive for 24 hours or as long as your doctor tells you. Summary Cardiac ablation is a procedure to destroy some heart tissue. This is done to treat heart rhythm problems. Tell your doctor about any medical conditions you may have. Tell him  or her about all medicines you are taking to treat them. This is a safe procedure. But problems may occur. These include infection, bruising, bleeding, and damage to nearby areas of your body. Follow what your doctor tells you about food and drink. You may also be told to change or stop some of your medicines. After the procedure, do not drive for 24 hours or as long as your doctor tells you. This information is not intended to replace advice given to you by your health care provider. Make sure you discuss any questions you have with your health care provider. Document Revised: 07/15/2019 Document Reviewed: 07/15/2019 Elsevier Patient Education  2022 [employer].

## 2021-07-23 ENCOUNTER — Telehealth: Payer: Self-pay | Admitting: Cardiology

## 2021-07-23 NOTE — Telephone Encounter (Signed)
Scheduler informed pt, per my instruction, that it may be a week/more before returning call to getting procedure rescheduled.

## 2021-07-23 NOTE — Telephone Encounter (Signed)
New Message:    Patient would like a new date for his Ablation after 09-12-21.

## 2021-07-25 NOTE — Telephone Encounter (Signed)
Wife of patient called to discus ablation

## 2021-07-27 ENCOUNTER — Encounter: Payer: Self-pay | Admitting: Cardiology

## 2021-07-27 ENCOUNTER — Telehealth: Payer: Self-pay | Admitting: Cardiology

## 2021-07-27 NOTE — Telephone Encounter (Signed)
Called patient and notified that we received his message and the information will be sent to Dr. Gershon Crane nurse who will be in touch.   Verbalized understanding.

## 2021-07-27 NOTE — Telephone Encounter (Signed)
Pt's wife states she would like to r/s the pt's upcoming Ablation because pt's mother is out of town and she wants to be here when he has the procedure

## 2021-07-30 NOTE — Telephone Encounter (Signed)
Patient's wife returning call. 

## 2021-07-30 NOTE — Telephone Encounter (Signed)
Left message to call back  

## 2021-07-31 NOTE — Telephone Encounter (Signed)
Spoke to patient. Moving procedure to 2/7. EP lab notified.

## 2021-08-15 ENCOUNTER — Encounter: Payer: Self-pay | Admitting: Cardiology

## 2021-08-22 ENCOUNTER — Encounter (HOSPITAL_COMMUNITY): Payer: Self-pay

## 2021-08-27 ENCOUNTER — Encounter: Payer: Self-pay | Admitting: Cardiology

## 2021-09-04 ENCOUNTER — Ambulatory Visit (HOSPITAL_COMMUNITY): Payer: 59

## 2021-09-07 ENCOUNTER — Telehealth (INDEPENDENT_AMBULATORY_CARE_PROVIDER_SITE_OTHER): Payer: Self-pay | Admitting: "Endocrinology

## 2021-09-07 NOTE — Telephone Encounter (Signed)
After we talked last time, he has been feeling better. He is scheduled to have a cardiac ablation procedure soon.  He never repeated his iron level and wants to do so now. I wrote an order for both an iron level and a CBC. Molli Knock, MD, CDCES

## 2021-09-21 ENCOUNTER — Other Ambulatory Visit (HOSPITAL_COMMUNITY): Payer: Self-pay | Admitting: Emergency Medicine

## 2021-09-21 ENCOUNTER — Telehealth (HOSPITAL_COMMUNITY): Payer: Self-pay | Admitting: Emergency Medicine

## 2021-09-21 DIAGNOSIS — I480 Paroxysmal atrial fibrillation: Secondary | ICD-10-CM

## 2021-09-21 DIAGNOSIS — Z01812 Encounter for preprocedural laboratory examination: Secondary | ICD-10-CM

## 2021-09-21 NOTE — Telephone Encounter (Signed)
Reaching out to patient to offer assistance regarding upcoming cardiac imaging study; pt verbalizes understanding of appt date/time, parking situation and where to check in, pre-test NPO status and medications ordered, and verified current allergies; name and call back number provided for further questions should they arise Rockwell Alexandria RN Navigator Cardiac Imaging [facility] [phone] office [phone] cell  Pt getting labs on Monday Denies iv issues Arrival 730a Daily meds per usual

## 2021-09-21 NOTE — Telephone Encounter (Signed)
Attempted to call patient regarding upcoming cardiac CT appointment. Left message on voicemail with name and callback number Solimar Maiden RN Navigator Cardiac Imaging [facility] [phone] Office [phone] Cell  Reminded to get labs 

## 2021-09-24 ENCOUNTER — Other Ambulatory Visit: Payer: 59 | Admitting: *Deleted

## 2021-09-24 ENCOUNTER — Other Ambulatory Visit: Payer: Self-pay

## 2021-09-25 ENCOUNTER — Ambulatory Visit (HOSPITAL_COMMUNITY): Payer: 59

## 2021-09-25 LAB — CBC
Hematocrit: 40.2 % (ref 37.5–51.0)
Hemoglobin: 14.3 g/dL (ref 13.0–17.7)
MCH: 34 pg — ABNORMAL HIGH (ref 26.6–33.0)
MCHC: 35.6 g/dL (ref 31.5–35.7)
MCV: 96 fL (ref 79–97)
Platelets: 226 10*3/uL (ref 150–450)
RBC: 4.2 x10E6/uL (ref 4.14–5.80)
RDW: 11.8 % (ref 11.6–15.4)
WBC: 7 10*3/uL (ref 3.4–10.8)

## 2021-09-25 LAB — BASIC METABOLIC PANEL
BUN/Creatinine Ratio: 17 (ref 9–20)
BUN: 16 mg/dL (ref 6–24)
CO2: 25 mmol/L (ref 20–29)
Calcium: 9.2 mg/dL (ref 8.7–10.2)
Chloride: 103 mmol/L (ref 96–106)
Creatinine, Ser: 0.95 mg/dL (ref 0.76–1.27)
Glucose: 77 mg/dL (ref 70–99)
Potassium: 4.9 mmol/L (ref 3.5–5.2)
Sodium: 141 mmol/L (ref 134–144)
eGFR: 96 mL/min/{1.73_m2} (ref >59)

## 2021-09-26 ENCOUNTER — Ambulatory Visit (HOSPITAL_COMMUNITY)
Admission: RE | Admit: 2021-09-26 | Discharge: 2021-09-26 | Disposition: A | Discharge status: Home / Self Care | Payer: 59 | Source: Ambulatory Visit | Attending: Cardiology | Admitting: Cardiology

## 2021-09-26 ENCOUNTER — Other Ambulatory Visit: Payer: Self-pay

## 2021-09-26 DIAGNOSIS — I480 Paroxysmal atrial fibrillation: Secondary | ICD-10-CM | POA: Insufficient documentation

## 2021-09-26 MED ORDER — IOHEXOL 350 MG/ML SOLN
100.0000 mL | Freq: Once | INTRAVENOUS | Status: AC | PRN
  Administered 2021-09-26: 100 mL via INTRAVENOUS

## 2021-09-27 ENCOUNTER — Encounter: Payer: Self-pay | Admitting: Cardiology

## 2021-09-28 ENCOUNTER — Telehealth: Payer: Self-pay | Admitting: *Deleted

## 2021-09-28 NOTE — Telephone Encounter (Signed)
Pt made aware that procedure scheduled for next week 2/7 needs to be rescheduled. Pt agreeable to 2/22, aware will send updated instructions via mychart. Advised/stressed the importance of not missing any blood thinners going forward and to let office know if does prior to the ablation. Patient verbalized understanding and agreeable to plan.

## 2021-10-16 ENCOUNTER — Encounter (HOSPITAL_COMMUNITY): Payer: Self-pay | Admitting: Cardiology

## 2021-10-16 NOTE — Anesthesia Preprocedure Evaluation (Addendum)
Anesthesia Evaluation  Patient identified by MRN, date of birth, ID band Patient awake    Reviewed: Allergy & Precautions, NPO status , Patient's Chart, lab work & pertinent test results  Airway Mallampati: II  TM Distance: >3 FB Neck ROM: Full    Dental no notable dental hx. (+) Teeth Intact, Dental Advisory Given   Pulmonary former smoker,    Pulmonary exam normal breath sounds clear to auscultation       Cardiovascular Normal cardiovascular exam+ dysrhythmias Atrial Fibrillation  Rhythm:Regular Rate:Normal     Neuro/Psych PSYCHIATRIC DISORDERS Anxiety Depression negative neurological ROS     GI/Hepatic Neg liver ROS, GERD  Medicated and Controlled,  Endo/Other  negative endocrine ROS  Renal/GU negative Renal ROS  negative genitourinary   Musculoskeletal negative musculoskeletal ROS (+)   Abdominal   Peds  Hematology  (+) Blood dyscrasia, , Eliquis therapy- last dose   Anesthesia Other Findings   Reproductive/Obstetrics                            Anesthesia Physical Anesthesia Plan  ASA: 2  Anesthesia Plan: General   Post-op Pain Management: Minimal or no pain anticipated   Induction: Intravenous  PONV Risk Score and Plan: 2 and Treatment may vary due to age or medical condition and Ondansetron  Airway Management Planned: Oral ETT  Additional Equipment: None  Intra-op Plan:   Post-operative Plan: Extubation in OR  Informed Consent: I have reviewed the patients History and Physical, chart, labs and discussed the procedure including the risks, benefits and alternatives for the proposed anesthesia with the patient or authorized representative who has indicated his/her understanding and acceptance.     Dental advisory given  Plan Discussed with: CRNA and Anesthesiologist  Anesthesia Plan Comments:        Anesthesia Quick Evaluation

## 2021-10-17 ENCOUNTER — Ambulatory Visit (HOSPITAL_BASED_OUTPATIENT_CLINIC_OR_DEPARTMENT_OTHER): Payer: 59 | Admitting: Anesthesiology

## 2021-10-17 ENCOUNTER — Other Ambulatory Visit: Payer: Self-pay

## 2021-10-17 ENCOUNTER — Ambulatory Visit (HOSPITAL_COMMUNITY): Payer: 59 | Admitting: Anesthesiology

## 2021-10-17 ENCOUNTER — Ambulatory Visit (HOSPITAL_COMMUNITY)
Admission: RE | Admit: 2021-10-17 | Discharge: 2021-10-17 | Disposition: A | Discharge status: Home / Self Care | Payer: 59 | Source: Ambulatory Visit | Attending: Cardiology | Admitting: Cardiology

## 2021-10-17 ENCOUNTER — Encounter (HOSPITAL_COMMUNITY)
Admission: RE | Disposition: A | Discharge status: Home / Self Care | Payer: 59 | Source: Ambulatory Visit | Attending: Cardiology

## 2021-10-17 DIAGNOSIS — I700 Atherosclerosis of aorta: Secondary | ICD-10-CM | POA: Diagnosis not present

## 2021-10-17 DIAGNOSIS — I480 Paroxysmal atrial fibrillation: Secondary | ICD-10-CM | POA: Diagnosis present

## 2021-10-17 DIAGNOSIS — Z87891 Personal history of nicotine dependence: Secondary | ICD-10-CM | POA: Insufficient documentation

## 2021-10-17 DIAGNOSIS — I4891 Unspecified atrial fibrillation: Secondary | ICD-10-CM | POA: Diagnosis present

## 2021-10-17 DIAGNOSIS — F419 Anxiety disorder, unspecified: Secondary | ICD-10-CM | POA: Diagnosis not present

## 2021-10-17 HISTORY — PX: ATRIAL FIBRILLATION ABLATION: EP1191

## 2021-10-17 LAB — POCT ACTIVATED CLOTTING TIME
Activated Clotting Time: 317 seconds
Activated Clotting Time: 341 seconds

## 2021-10-17 SURGERY — ATRIAL FIBRILLATION ABLATION
Anesthesia: General

## 2021-10-17 MED ORDER — ACETAMINOPHEN 325 MG PO TABS
650.0000 mg | ORAL_TABLET | ORAL | Status: DC | PRN
  Administered 2021-10-17: 650 mg via ORAL

## 2021-10-17 MED ORDER — HEPARIN SODIUM (PORCINE) 1000 UNIT/ML IJ SOLN
INTRAMUSCULAR | Status: DC | PRN
  Administered 2021-10-17: 1000 [IU] via INTRAVENOUS
  Administered 2021-10-17: 2000 [IU] via INTRAVENOUS
  Administered 2021-10-17: 14000 [IU] via INTRAVENOUS

## 2021-10-17 MED ORDER — PROPOFOL 500 MG/50ML IV EMUL
INTRAVENOUS | Status: DC | PRN
  Administered 2021-10-17: 30 ug/kg/min via INTRAVENOUS

## 2021-10-17 MED ORDER — SODIUM CHLORIDE 0.9 % IV SOLN: 250.0000 mL | INTRAVENOUS | Status: DC | PRN

## 2021-10-17 MED ORDER — EPHEDRINE SULFATE (PRESSORS) 50 MG/ML IJ SOLN
INTRAMUSCULAR | Status: DC | PRN
Start: 2021-10-17 — End: 2021-10-17
  Administered 2021-10-17 (×2): 5 mg via INTRAVENOUS

## 2021-10-17 MED ORDER — FENTANYL CITRATE (PF) 100 MCG/2ML IJ SOLN
INTRAMUSCULAR | Status: AC
  Filled 2021-10-17: qty 2

## 2021-10-17 MED ORDER — MIDAZOLAM HCL 5 MG/5ML IJ SOLN
INTRAMUSCULAR | Status: DC | PRN
  Administered 2021-10-17: 2 mg via INTRAVENOUS

## 2021-10-17 MED ORDER — HEPARIN (PORCINE) IN NACL 1000-0.9 UT/500ML-% IV SOLN
INTRAVENOUS | Status: AC
  Filled 2021-10-17: qty 500

## 2021-10-17 MED ORDER — HEPARIN (PORCINE) IN NACL 1000-0.9 UT/500ML-% IV SOLN
INTRAVENOUS | Status: DC | PRN
  Administered 2021-10-17 (×5): 500 mL

## 2021-10-17 MED ORDER — ACETAMINOPHEN 325 MG PO TABS
ORAL_TABLET | ORAL | Status: AC
  Filled 2021-10-17: qty 2

## 2021-10-17 MED ORDER — SUGAMMADEX SODIUM 200 MG/2ML IV SOLN
INTRAVENOUS | Status: DC | PRN
Start: 2021-10-17 — End: 2021-10-17
  Administered 2021-10-17: 150 mg via INTRAVENOUS

## 2021-10-17 MED ORDER — PROTAMINE SULFATE 10 MG/ML IV SOLN
INTRAVENOUS | Status: DC | PRN
  Administered 2021-10-17: 40 mg via INTRAVENOUS

## 2021-10-17 MED ORDER — LACTATED RINGERS IV SOLN: INTRAVENOUS | Status: DC | PRN

## 2021-10-17 MED ORDER — FENTANYL CITRATE (PF) 250 MCG/5ML IJ SOLN
INTRAMUSCULAR | Status: DC | PRN
Start: 2021-10-17 — End: 2021-10-17
  Administered 2021-10-17: 100 ug via INTRAVENOUS

## 2021-10-17 MED ORDER — SODIUM CHLORIDE 0.9% FLUSH: 3.0000 mL | INTRAVENOUS | Status: DC | PRN

## 2021-10-17 MED ORDER — LIDOCAINE 2% (20 MG/ML) 5 ML SYRINGE
INTRAMUSCULAR | Status: DC | PRN
  Administered 2021-10-17: 80 mg via INTRAVENOUS

## 2021-10-17 MED ORDER — HEPARIN SODIUM (PORCINE) 1000 UNIT/ML IJ SOLN
INTRAMUSCULAR | Status: AC
  Filled 2021-10-17: qty 10

## 2021-10-17 MED ORDER — FENTANYL CITRATE (PF) 100 MCG/2ML IJ SOLN
50.0000 ug | Freq: Once | INTRAMUSCULAR | Status: AC
  Administered 2021-10-17: 50 ug via INTRAVENOUS

## 2021-10-17 MED ORDER — PROPOFOL 10 MG/ML IV BOLUS
INTRAVENOUS | Status: DC | PRN
Start: 2021-10-17 — End: 2021-10-17
  Administered 2021-10-17: 40 mg via INTRAVENOUS
  Administered 2021-10-17: 160 mg via INTRAVENOUS
  Administered 2021-10-17: 20 mg via INTRAVENOUS

## 2021-10-17 MED ORDER — SODIUM CHLORIDE 0.9% FLUSH: 3.0000 mL | Freq: Two times a day (BID) | INTRAVENOUS | Status: DC

## 2021-10-17 MED ORDER — ROCURONIUM BROMIDE 10 MG/ML (PF) SYRINGE
PREFILLED_SYRINGE | INTRAVENOUS | Status: DC | PRN
  Administered 2021-10-17: 60 mg via INTRAVENOUS

## 2021-10-17 MED ORDER — SODIUM CHLORIDE 0.9 % IV SOLN: INTRAVENOUS | Status: DC

## 2021-10-17 MED ORDER — DOBUTAMINE INFUSION FOR EP/ECHO/NUC (1000 MCG/ML)
INTRAVENOUS | Status: AC
  Filled 2021-10-17: qty 250

## 2021-10-17 MED ORDER — ONDANSETRON HCL 4 MG/2ML IJ SOLN
4.0000 mg | Freq: Four times a day (QID) | INTRAMUSCULAR | Status: DC | PRN
  Administered 2021-10-17: 4 mg via INTRAVENOUS

## 2021-10-17 MED ORDER — ONDANSETRON HCL 4 MG/2ML IJ SOLN
INTRAMUSCULAR | Status: AC
  Filled 2021-10-17: qty 2

## 2021-10-17 MED ORDER — HEPARIN SODIUM (PORCINE) 1000 UNIT/ML IJ SOLN
INTRAMUSCULAR | Status: DC | PRN
  Administered 2021-10-17: 1000 [IU] via INTRAVENOUS

## 2021-10-17 MED ORDER — ONDANSETRON HCL 4 MG/2ML IJ SOLN
INTRAMUSCULAR | Status: DC | PRN
  Administered 2021-10-17: 4 mg via INTRAVENOUS

## 2021-10-17 MED ORDER — DOBUTAMINE INFUSION FOR EP/ECHO/NUC (1000 MCG/ML)
INTRAVENOUS | Status: DC | PRN
Start: 2021-10-17 — End: 2021-10-17
  Administered 2021-10-17: 20 ug/kg/min via INTRAVENOUS

## 2021-10-17 MED ORDER — PHENYLEPHRINE HCL-NACL 20-0.9 MG/250ML-% IV SOLN
INTRAVENOUS | Status: DC | PRN
  Administered 2021-10-17: 15 ug/min via INTRAVENOUS

## 2021-10-17 SURGICAL SUPPLY — 20 items
BAG SNAP BAND KOVER 36X36 (MISCELLANEOUS) ×1 IMPLANT
BLANKET WARM UNDERBOD FULL ACC (MISCELLANEOUS) ×3 IMPLANT
CATH OCTARAY 2.0 F 3-3-3-3-3 (CATHETERS) ×1 IMPLANT
CATH S CIRCA THERM PROBE 10F (CATHETERS) ×1 IMPLANT
CATH SMTCH THERMOCOOL SF DF (CATHETERS) ×1 IMPLANT
CATH SOUNDSTAR ECO 8FR (CATHETERS) ×1 IMPLANT
CATH WEB BI DIR CSDF CRV REPRO (CATHETERS) ×1 IMPLANT
CLOSURE PERCLOSE PROSTYLE (VASCULAR PRODUCTS) ×4 IMPLANT
COVER SWIFTLINK CONNECTOR (BAG) ×3 IMPLANT
KIT VERSACROSS STEERABLE D1 (CATHETERS) ×1 IMPLANT
PACK EP LATEX FREE (CUSTOM PROCEDURE TRAY) ×2
PACK EP LF (CUSTOM PROCEDURE TRAY) ×2 IMPLANT
PAD DEFIB RADIO PHYSIO CONN (PAD) ×3 IMPLANT
PATCH CARTO3 (PAD) ×1 IMPLANT
SHEATH CARTO VIZIGO SM CVD (SHEATH) ×1 IMPLANT
SHEATH PINNACLE 7F 10CM (SHEATH) ×1 IMPLANT
SHEATH PINNACLE 8F 10CM (SHEATH) ×2 IMPLANT
SHEATH PINNACLE 9F 10CM (SHEATH) ×1 IMPLANT
SHEATH PROBE COVER 6X72 (BAG) ×1 IMPLANT
TUBING SMART ABLATE COOLFLOW (TUBING) ×1 IMPLANT

## 2021-10-17 NOTE — Progress Notes (Addendum)
Spoke with Dr. Elberta Fortis, Asked RN to have Otilio Saber see pt, Mardelle Matte notified at approximately 1125 to see pt

## 2021-10-17 NOTE — Progress Notes (Addendum)
Andrew [name] at bedside to see pt, ok to move to short stay, no new orders at this time, safety maintained,  pt attempting to void per urinal, Per Arita Miss., ok for short stay to bladder scan and I/O cath pt if necessary, states he will let short stay know

## 2021-10-17 NOTE — H&P (Signed)
Electrophysiology Office Note   Date:  10/17/2021   ID:  Andrew [name], DOB 1970-11-30, MRN [id number]  PCP:  [name]  Cardiologist:  Andrew [name] Primary Electrophysiologist:  Andrew Wolaver Meredith Leeds, MD    Chief Complaint: AF   History of Present Illness: Andrew [name] is a 51 y.o. male who is being seen today for the evaluation of AF at the request of Andrew [name]. Presenting today for electrophysiology evaluation.  She has a history significant for atrial fibrillation, aortic sclerosis.  She has been having intermittent episodes of atrial fibrillation with heart rates in the 150s.  Episodes last between 4 to 8 hours.  These episodes make him feel tired and fatigued.  He has to lie down.  He uses as needed diltiazem to compensate.  Since being diagnosed with atrial fibrillation, he has had quite a bit of anxiety.  He feels quite bad in atrial fibrillation, and has changed his daily activities to try and stay in normal rhythm.  He has not been exercising other than walking.  He also feels like he is having COVID atrial fibrillation if he bends down  Today, denies symptoms of palpitations, chest pain, shortness of breath, orthopnea, PND, lower extremity edema, claudication, dizziness, presyncope, syncope, bleeding, or neurologic sequela. The patient is tolerating medications without difficulties. Plan for AF ablation today.   Past Medical History:  Diagnosis Date   Anxiety    Depression    GERD (gastroesophageal reflux disease)    Past Surgical History:  Procedure Laterality Date   CHOLECYSTECTOMY N/A 07/11/2016   Procedure: LAPAROSCOPIC CHOLECYSTECTOMY;  Surgeon: Andrew Keens, MD;  Location: [facility];  Service: General;  Laterality: N/A;   NASAL POLYP EXCISION     VASECTOMY       Current Facility-Administered Medications  Medication Dose Route Frequency Provider Last Rate Last Admin   0.9 %  sodium chloride infusion   Intravenous Continuous  Andrew [name], Andrew Doyne, MD        Allergies:   Patient has Andrew known allergies.   Social History:  The patient  reports that he quit smoking about 15 years ago. His smoking use included cigarettes. He has never used smokeless tobacco. He reports current alcohol use. He reports that he does not use drugs.   Family History:  The patient's family history includes Fainting in his maternal grandmother.   ROS:  Please see the history of present illness.   Otherwise, review of systems is positive for none.   All other systems are reviewed and negative.   PHYSICAL EXAM: VS:  BP 129/82    Pulse (!) 33    Temp 98.2 F (36.8 C) (Oral)    Resp 17    Ht 5\' 10"  (1.778 m)    Wt 79.4 kg    SpO2 95%    BMI 25.11 kg/m  , BMI Body mass index is 25.11 kg/m. GEN: Well nourished, well developed, in Andrew acute distress  HEENT: normal  Neck: Andrew JVD, carotid bruits, or masses Cardiac: RRR; Andrew murmurs, rubs, or gallops,Andrew edema  Respiratory:  clear to auscultation bilaterally, normal work of breathing GI: soft, nontender, nondistended, + BS MS: Andrew deformity or atrophy  Skin: warm and dry Neuro:  Strength and sensation are intact Psych: euthymic mood, full affect  Recent Labs: 07/10/2021: ALT 22; TSH 1.17 09/24/2021: BUN 16; Creatinine, Ser 0.95; Hemoglobin 14.3; Platelets 226; Potassium 4.9; Sodium 141    Lipid Panel  Andrew results [name] for:  CHOL, TRIG, HDL, CHOLHDL, VLDL, LDLCALC, LDLDIRECT   Wt Readings from Last 3 Encounters:  10/17/21 79.4 kg  07/18/21 80.6 kg  07/10/21 80.1 kg      Other studies Reviewed: Additional studies/ records that were reviewed today include: TTE 07/29/19  Review of the above records today demonstrates:   1. Left ventricular ejection fraction, by 3D calculation, is 62%. The  left ventricle has normal function. There is Andrew left ventricular  hypertrophy.   2. The left ventricle has Andrew regional wall motion abnormalities.   3. Global right ventricle has normal systolic  function.The right  ventricular size is normal. Andrew increase in right ventricular wall  thickness.   4. Left atrial size was normal.   5. Right atrial size was normal.   6. The mitral valve is normal in structure. Trivial mitral valve  regurgitation. Andrew evidence of mitral stenosis.   7. The tricuspid valve is normal in structure. Tricuspid valve  regurgitation is trivial.   8. The aortic valve is tricuspid. Aortic valve regurgitation is trivial.  Andrew evidence of aortic valve sclerosis or stenosis.   9. The pulmonic valve was normal in structure. Pulmonic valve  regurgitation is trivial.  10. Normal pulmonary artery systolic pressure.  11. The inferior vena cava is normal in size with greater than 50%  respiratory variability, suggesting right atrial pressure of 3 mmHg.  12. The average left ventricular global longitudinal strain is -19.6 %.    ASSESSMENT AND PLAN:  1.  Paroxysmal atrial fibrillation: Andrew [name] has presented today for surgery, with the diagnosis of atrial fibrillation.  The various methods of treatment have been discussed with the patient and family. After consideration of risks, benefits and other options for treatment, the patient has consented to  Procedure(s): Catheter ablation as a surgical intervention .  Risks include but not limited to complete heart block, stroke, esophageal damage, nerve damage, bleeding, vascular damage, tamponade, perforation, MI, and death. The patient's history has been reviewed, patient examined, Andrew change in status, stable for surgery.  I have reviewed the patient's chart and labs.  Questions were answered to the patient's satisfaction.    Andrew Donlan Curt Bears, MD 10/17/2021 7:07 AM

## 2021-10-17 NOTE — Progress Notes (Signed)
°  Paged to see pt for abdominal pain.    Pt complaining of suprapubic pain and urge to urinate without UOP.  + BS.   Likely post anasthesia.   Reviewed with Dr. Elberta Fortis.  Ok to scan and in/out of urinary retention persists.    Casimiro Needle [address] [place], PA-C  10/17/2021 11:47 AM

## 2021-10-17 NOTE — Progress Notes (Signed)
Ambulated patient to BR. Both groin dressings are CDI.

## 2021-10-17 NOTE — Discharge Instructions (Signed)

## 2021-10-17 NOTE — Anesthesia Postprocedure Evaluation (Signed)
Anesthesia Post Note  Patient: Andrew [name]  Procedure(s) Performed: ATRIAL FIBRILLATION ABLATION     Patient location during evaluation: PACU Anesthesia Type: General Level of consciousness: awake and alert and oriented Pain management: pain level controlled Vital Signs Assessment: post-procedure vital signs reviewed and stable Respiratory status: spontaneous breathing, nonlabored ventilation and respiratory function stable Cardiovascular status: blood pressure returned to baseline and stable Postop Assessment: no apparent nausea or vomiting Anesthetic complications: no   No notable events documented.  Last Vitals:  Vitals:   10/17/21 1200 10/17/21 1215  BP: 96/64 (!) 108/58  Pulse: 60 (!) 57  Resp: 17 13  Temp:    SpO2: 98% 97%    Last Pain:  Vitals:   10/17/21 1215  TempSrc:   PainSc: 0-No pain                 Makaila Windle A.

## 2021-10-17 NOTE — Transfer of Care (Signed)
Immediate Anesthesia Transfer of Care Note  Patient: Andrew [name]  Procedure(s) Performed: ATRIAL FIBRILLATION ABLATION  Patient Location: Cath Lab  Anesthesia Type:General  Level of Consciousness: awake, patient cooperative and responds to stimulation  Airway & Oxygen Therapy: Patient Spontanous Breathing and Patient connected to nasal cannula oxygen  Post-op Assessment: Report given to RN and Post -op Vital signs reviewed and stable  Post vital signs: Reviewed and stable  Last Vitals:  Vitals Value Taken Time  BP    Temp    Pulse 67 10/17/21 1030  Resp    SpO2 98 % 10/17/21 1030  Vitals shown include unvalidated device data.  Last Pain:  Vitals:   10/17/21 0830  TempSrc:   PainSc: 0-No pain         Complications: No notable events documented.

## 2021-10-17 NOTE — Anesthesia Procedure Notes (Signed)
Procedure Name: Intubation Date/Time: 10/17/2021 8:37 AM Performed by: Glynda Jaeger, CRNA Pre-anesthesia Checklist: Patient identified, Patient being monitored, Timeout performed, Emergency Drugs available and Suction available Patient Re-evaluated:Patient Re-evaluated prior to induction Oxygen Delivery Method: Circle System Utilized Preoxygenation: Pre-oxygenation with 100% oxygen Induction Type: IV induction Ventilation: Mask ventilation without difficulty Laryngoscope Size: Mac and 4 Grade View: Grade II Tube type: Oral Tube size: 7.5 mm Number of attempts: 1 Airway Equipment and Method: Stylet Placement Confirmation: ETT inserted through vocal cords under direct vision, positive ETCO2 and breath sounds checked- equal and bilateral Secured at: 22 cm Tube secured with: Tape Dental Injury: Teeth and Oropharynx as per pre-operative assessment

## 2021-10-17 NOTE — Progress Notes (Signed)
Pt with c/o mid, lower abdominal pain, "hurts", 3/10, bilateral groins remain soft, denies back pain, no bowel sounds noted at this time, safety maintained, Dr. Malen Gauze from Anesthesia to bedside, informed RN to give IV zofran and IV fentanyl, see MAR, instructed nurse to make Dr. Elberta Fortis aware, paged at [phone]

## 2021-10-18 ENCOUNTER — Encounter (HOSPITAL_COMMUNITY): Payer: Self-pay | Admitting: Cardiology

## 2021-10-19 DIAGNOSIS — I4891 Unspecified atrial fibrillation: Secondary | ICD-10-CM | POA: Insufficient documentation

## 2021-11-03 ENCOUNTER — Other Ambulatory Visit: Payer: Self-pay | Admitting: Interventional Cardiology

## 2021-11-14 ENCOUNTER — Encounter (HOSPITAL_COMMUNITY): Payer: Self-pay | Admitting: Nurse Practitioner

## 2021-11-14 ENCOUNTER — Ambulatory Visit (HOSPITAL_COMMUNITY)
Admission: RE | Admit: 2021-11-14 | Discharge: 2021-11-14 | Disposition: A | Discharge status: Home / Self Care | Payer: 59 | Source: Ambulatory Visit | Attending: Nurse Practitioner | Admitting: Nurse Practitioner

## 2021-11-14 ENCOUNTER — Other Ambulatory Visit: Payer: Self-pay

## 2021-11-14 VITALS — BP 118/78 | HR 152 | Ht 70.0 in | Wt 179.6 lb

## 2021-11-14 DIAGNOSIS — Z79899 Other long term (current) drug therapy: Secondary | ICD-10-CM | POA: Insufficient documentation

## 2021-11-14 DIAGNOSIS — Z7901 Long term (current) use of anticoagulants: Secondary | ICD-10-CM | POA: Insufficient documentation

## 2021-11-14 DIAGNOSIS — I4891 Unspecified atrial fibrillation: Secondary | ICD-10-CM | POA: Diagnosis present

## 2021-11-14 DIAGNOSIS — D6869 Other thrombophilia: Secondary | ICD-10-CM | POA: Diagnosis not present

## 2021-11-14 DIAGNOSIS — I480 Paroxysmal atrial fibrillation: Secondary | ICD-10-CM | POA: Diagnosis not present

## 2021-11-14 DIAGNOSIS — I483 Typical atrial flutter: Secondary | ICD-10-CM | POA: Diagnosis not present

## 2021-11-14 NOTE — Progress Notes (Signed)
? ?Primary Care Physician: [facility] ?Referring Physician: Dr. Elberta Fortis ?Cardiologist: Dr. Katrinka Blazing  ? ? ?Andrew [name] is a 51 y.o. male with a h/o  afib ablation one month ago. He had several in and out episode in the first day or two. From  then no other episode  until 3/8. Today in the office, EKG showed what appeared to be typical atrial flutter  2:1 at 150 bpm and then would return to SR. On auscultation he was in SR. His  apple watch correlates with this. Otherwise, no swallowing or groin issues. He is back to the gym exercising without any arrhythmia. He continues on eliquis.  ? ?Today, he denies symptoms of palpitations, chest pain, shortness of breath, orthopnea, PND, lower extremity edema, dizziness, presyncope, syncope, or neurologic sequela. The patient is tolerating medications without difficulties and is otherwise without complaint today.  ? ?Past Medical History:  ?Diagnosis Date  ? Anxiety   ? Depression   ? GERD (gastroesophageal reflux disease)   ? ?Past Surgical History:  ?Procedure Laterality Date  ? ATRIAL FIBRILLATION ABLATION N/A 10/17/2021  ? Procedure: ATRIAL FIBRILLATION ABLATION;  Surgeon: Regan Lemming, MD;  Location: [facility];  Service: Cardiovascular;  Laterality: N/A;  ? CHOLECYSTECTOMY N/A 07/11/2016  ? Procedure: LAPAROSCOPIC CHOLECYSTECTOMY;  Surgeon: Abigail Miyamoto, MD;  Location: [facility];  Service: General;  Laterality: N/A;  ? NASAL POLYP EXCISION    ? VASECTOMY    ? ? ?Current Outpatient Medications  ?Medication Sig Dispense Refill  ? apixaban (ELIQUIS) 5 MG TABS tablet Take 1 tablet (5 mg total) by mouth 2 (two) times daily. 60 tablet 11  ? b complex vitamins capsule Take 1 capsule by mouth daily.    ? Coenzyme Q10 (CO Q 10) 10 MG CAPS Take 1 capsule by mouth daily.    ? diltiazem (CARDIZEM CD) 180 MG 24 hr capsule TAKE 1 CAPSULE BY MOUTH EVERY DAY 90 capsule 0  ? diltiazem (CARDIZEM) 30 MG tablet Take 1 tablet every 4 hours AS NEEDED for  AFIB heart rate >100 45 tablet 1  ? escitalopram (LEXAPRO) 10 MG tablet Take 10 mg by mouth daily.    ? fluticasone (FLONASE) 50 MCG/ACT nasal spray Place 2 sprays into both nostrils daily.    ? LORazepam (ATIVAN) 1 MG tablet Take 0.5 mg by mouth 2 (two) times daily as needed for anxiety.    ? Multiple Vitamin (MULTIVITAMIN WITH MINERALS) TABS tablet Take 1 tablet by mouth daily.    ? Turmeric (CURCUMIN 95 PO) Take 1 tablet by mouth daily.    ? vitamin C (ASCORBIC ACID) 500 MG tablet Take 500 mg by mouth daily.    ? ?No current facility-administered medications for this encounter.  ? ? ?No Known Allergies ? ?Social History  ? ?Socioeconomic History  ? Marital status: Married  ?  Spouse name: Not on file  ? Number of children: Not on file  ? Years of education: Not on file  ? Highest education level: Not on file  ?Occupational History  ? Not on file  ?Tobacco Use  ? Smoking status: Former  ?  Types: Cigarettes  ?  Quit date: 07/11/2006  ?  Years since quitting: 15.3  ? Smokeless tobacco: Never  ? Tobacco comments:  ?  stopped 10 years ago  ?Substance and Sexual Activity  ? Alcohol use: Yes  ?  Comment: occ  ? Drug use: No  ? Sexual activity: Not on file  ?  Other Topics Concern  ? Not on file  ?Social History Narrative  ? Not on file  ? ?Social Determinants of Health  ? ?Financial Resource Strain: Not on file  ?Food Insecurity: Not on file  ?Transportation Needs: Not on file  ?Physical Activity: Not on file  ?Stress: Not on file  ?Social Connections: Not on file  ?Intimate Partner Violence: Not on file  ? ? ?Family History  ?Problem Relation Age of Onset  ? Fainting Maternal Grandmother   ? ? ?ROS- All systems are reviewed and negative except as per the HPI above ? ?Physical Exam: ?Vitals:  ? 11/14/21 1321  ?BP: 118/78  ?Pulse: (!) 152  ?Weight: 81.5 kg  ?Height: 5\' 10"  (1.778 m)  ? ?Wt Readings from Last 3 Encounters:  ?11/14/21 81.5 kg  ?10/17/21 79.4 kg  ?07/18/21 80.6 kg  ? ? ?Labs: ?Lab Results  ?Component Value  Date  ? NA 141 09/24/2021  ? K 4.9 09/24/2021  ? CL 103 09/24/2021  ? CO2 25 09/24/2021  ? GLUCOSE 77 09/24/2021  ? BUN 16 09/24/2021  ? CREATININE 0.95 09/24/2021  ? CALCIUM 9.2 09/24/2021  ? ?No results found for: INR ?No results found for: CHOL, HDL, LDLCALC, TRIG ? ? ?GEN- The patient is well appearing, alert and oriented x 3 today.   ?Head- normocephalic, atraumatic ?Eyes-  Sclera clear, conjunctiva pink ?Ears- hearing intact ?Oropharynx- clear ?Neck- supple, no JVP ?Lymph- no cervical lymphadenopathy ?Lungs- Clear to ausculation bilaterally, normal work of breathing ?Heart- Regular rate and rhythm, no murmurs, rubs or gallops, PMI not laterally displaced ?GI- soft, NT, ND, + BS ?Extremities- no clubbing, cyanosis, or edema ?MS- no significant deformity or atrophy ?Skin- no rash or lesion ?Psych- euthymic mood, full affect ?Neuro- strength and sensation are intact ? ?EKG- ?First ekg showed possible 2:1 typical  atrial  flutter  ?Second EKG shows SR with salvos of atrial tachycardia  ? ? ?Assessment and Plan:  ?1. Afib  ?S/p ablation  ?Ekg today showed short runs of typical atrial flutter then returned to SR ?This has not been an ongoing issue.more frequent today  ?Left clinic in SR ?We discussed increasing daily Cardizem but he doe not want to increase meds ?He will try to take 15 mg of Cardizem if it becomes frequent as he just does not want to Take extra meds and it makes him feel sleepy ? ?2. CHA2DS2VASc  score of 0 ?Continue eliquis 5 mg bid through out the 3 month recovery period without interruption ? ?F/u with Dr. 09/26/2021 5/25  ? ?6/25 Elvina Sidle, ANP-C ?Afib Clinic ?[facility] ?[address] ?[place], [place] ?[phone]  ?

## 2021-12-07 NOTE — Telephone Encounter (Signed)
See note from this date. Complete ?

## 2022-01-17 ENCOUNTER — Ambulatory Visit (INDEPENDENT_AMBULATORY_CARE_PROVIDER_SITE_OTHER): Payer: 59 | Admitting: Cardiology

## 2022-01-17 ENCOUNTER — Encounter: Payer: Self-pay | Admitting: *Deleted

## 2022-01-17 ENCOUNTER — Encounter: Payer: Self-pay | Admitting: Cardiology

## 2022-01-17 VITALS — BP 108/82 | HR 63 | Ht 70.0 in | Wt 175.4 lb

## 2022-01-17 DIAGNOSIS — I4819 Other persistent atrial fibrillation: Secondary | ICD-10-CM

## 2022-01-17 DIAGNOSIS — Z01812 Encounter for preprocedural laboratory examination: Secondary | ICD-10-CM

## 2022-01-17 DIAGNOSIS — I483 Typical atrial flutter: Secondary | ICD-10-CM | POA: Diagnosis not present

## 2022-01-17 DIAGNOSIS — I480 Paroxysmal atrial fibrillation: Secondary | ICD-10-CM | POA: Diagnosis not present

## 2022-01-17 NOTE — Patient Instructions (Signed)
Medication Instructions:  Your physician recommends that you continue on your current medications as directed. Please refer to the Current Medication list given to you today.  *If you need a refill on your cardiac medications before your next appointment, please call your pharmacy*   Lab Work: Pre procedure labs -- see procedure instruction letter:  BMP & CBC  If you have labs (blood work) drawn today and your tests are completely normal, you will receive your results only by: [place] (if you have MyChart) OR A paper copy in the mail If you have any lab test that is abnormal or we need to change your treatment, we will call you to review the results.   Testing/Procedures: Your physician has requested that you have cardiac CT within 7 days PRIOR to your ablation. Cardiac computed tomography (CT) is a painless test that uses an x-ray machine to take clear, detailed pictures of your heart.  Please follow instruction below located under "other instructions". You will get a call from our office to schedule the date for this test.  Your physician has recommended that you have an ablation. Catheter ablation is a medical procedure used to treat some cardiac arrhythmias (irregular heartbeats). During catheter ablation, a long, thin, flexible tube is put into a blood vessel in your groin (upper thigh), or neck. This tube is called an ablation catheter. It is then guided to your heart through the blood vessel. Radio frequency waves destroy small areas of heart tissue where abnormal heartbeats may cause an arrhythmia to start. Please follow instruction letter given to you today.   Follow-Up: At [facility], you and your health needs are our priority.  As part of our continuing mission to provide you with exceptional heart care, we have created designated Provider Care Teams.  These Care Teams include your primary Cardiologist (physician) and Advanced Practice Providers (APPs -  Physician  Assistants and Nurse Practitioners) who all work together to provide you with the care you need, when you need it.  Your next appointment:   1 month(s) after your ablation  The format for your next appointment:   In Person  Provider:   AFib clinic   Thank you for choosing [facility]!!   Trinidad Curet, RN [phone]    Other Instructions

## 2022-01-17 NOTE — Progress Notes (Signed)
Electrophysiology Office Note   Date:  01/17/2022   ID:  Andrew [name], DOB 1970-09-19, MRN [id number]  PCP:  [name]  Cardiologist:  Tamala Julian Primary Electrophysiologist:  [name], MD    Chief Complaint: AF   History of Present Illness: Andrew [name] is a 51 y.o. male who is being seen today for the evaluation of AF at the request of Pa, [place]*. Presenting today for electrophysiology evaluation.  He has a history significant for atrial fibrillation, aortic sclerosis.  He has been having intermittent episodes of atrial fibrillation with heart rates in the 150s.  Episodes last between 4 to 8 hours.  He is now status post atrial fibrillation ablation 10/17/2021.  Today, denies symptoms of palpitations, chest pain, shortness of breath, orthopnea, PND, lower extremity edema, claudication, dizziness, presyncope, syncope, bleeding, or neurologic sequela. The patient is tolerating medications without difficulties.  Fortunately is continued to have arrhythmias.  He has no chest pain or shortness of breath, but does note weakness and fatigue and palpitations when he is having his arrhythmia.  He presented to A-fib clinic and did have atrial flutter.  He is also had episodes of atrial fibrillation that have been short-lived as documented by his cardia mobile app.   Past Medical History:  Diagnosis Date   Anxiety    Depression    GERD (gastroesophageal reflux disease)    Past Surgical History:  Procedure Laterality Date   ATRIAL FIBRILLATION ABLATION N/A 10/17/2021   Procedure: ATRIAL FIBRILLATION ABLATION;  Surgeon: Constance Haw, MD;  Location: [facility];  Service: Cardiovascular;  Laterality: N/A;   CHOLECYSTECTOMY N/A 07/11/2016   Procedure: LAPAROSCOPIC CHOLECYSTECTOMY;  Surgeon: Coralie Keens, MD;  Location: [place];  Service: General;  Laterality: N/A;   NASAL POLYP EXCISION     VASECTOMY       Current Outpatient  Medications  Medication Sig Dispense Refill   apixaban (ELIQUIS) 5 MG TABS tablet Take 1 tablet (5 mg total) by mouth 2 (two) times daily. 60 tablet 11   b complex vitamins capsule Take 1 capsule by mouth daily.     Coenzyme Q10 (CO Q 10) 10 MG CAPS Take 1 capsule by mouth daily.     diltiazem (CARDIZEM CD) 180 MG 24 hr capsule TAKE 1 CAPSULE BY MOUTH EVERY DAY 90 capsule 0   diltiazem (CARDIZEM) 30 MG tablet Take 1 tablet every 4 hours AS NEEDED for AFIB heart rate >100 45 tablet 1   escitalopram (LEXAPRO) 10 MG tablet Take 10 mg by mouth daily.     fluticasone (FLONASE) 50 MCG/ACT nasal spray Place 2 sprays into both nostrils daily.     LORazepam (ATIVAN) 1 MG tablet Take 0.5 mg by mouth 2 (two) times daily as needed for anxiety.     Multiple Vitamin (MULTIVITAMIN WITH MINERALS) TABS tablet Take 1 tablet by mouth daily.     Turmeric (CURCUMIN 95 PO) Take 2 tablets by mouth daily.     vitamin C (ASCORBIC ACID) 500 MG tablet Take 500 mg by mouth daily.     No current facility-administered medications for this visit.    Allergies:   Patient has no known allergies.   Social History:  The patient  reports that he quit smoking about 15 years ago. His smoking use included cigarettes. He has never used smokeless tobacco. He reports current alcohol use. He reports that he does not use drugs.   Family History:  The patient's  family history includes Fainting in his maternal grandmother.   ROS:  Please see the history of present illness.   Otherwise, review of systems is positive for none.   All other systems are reviewed and negative.   PHYSICAL EXAM: VS:  BP 108/82   Pulse 63   Ht 5\' 10"  (1.778 m)   Wt 175 lb 6.4 oz (79.6 kg)   SpO2 96%   BMI 25.17 kg/m  , BMI Body mass index is 25.17 kg/m. GEN: Well nourished, well developed, in no acute distress  HEENT: normal  Neck: no JVD, carotid bruits, or masses Cardiac: RRR; no murmurs, rubs, or gallops,no edema  Respiratory:  clear to  auscultation bilaterally, normal work of breathing GI: soft, nontender, nondistended, + BS MS: no deformity or atrophy  Skin: warm and dry Neuro:  Strength and sensation are intact Psych: euthymic mood, full affect  EKG:  EKG is ordered today. Personal review of the ekg ordered shows sinus rhythm, rate 63  Recent Labs: 07/10/2021: ALT 22; TSH 1.17 09/24/2021: BUN 16; Creatinine, Ser 0.95; Hemoglobin 14.3; Platelets 226; Potassium 4.9; Sodium 141    Lipid Panel  No results found for: CHOL, TRIG, HDL, CHOLHDL, VLDL, LDLCALC, LDLDIRECT   Wt Readings from Last 3 Encounters:  01/17/22 175 lb 6.4 oz (79.6 kg)  11/14/21 179 lb 9.6 oz (81.5 kg)  10/17/21 175 lb (79.4 kg)      Other studies Reviewed: Additional studies/ records that were reviewed today include: TTE 07/29/19  Review of the above records today demonstrates:   1. Left ventricular ejection fraction, by 3D calculation, is 62%. The  left ventricle has normal function. There is no left ventricular  hypertrophy.   2. The left ventricle has no regional wall motion abnormalities.   3. Global right ventricle has normal systolic function.The right  ventricular size is normal. No increase in right ventricular wall  thickness.   4. Left atrial size was normal.   5. Right atrial size was normal.   6. The mitral valve is normal in structure. Trivial mitral valve  regurgitation. No evidence of mitral stenosis.   7. The tricuspid valve is normal in structure. Tricuspid valve  regurgitation is trivial.   8. The aortic valve is tricuspid. Aortic valve regurgitation is trivial.  No evidence of aortic valve sclerosis or stenosis.   9. The pulmonic valve was normal in structure. Pulmonic valve  regurgitation is trivial.  10. Normal pulmonary artery systolic pressure.  11. The inferior vena cava is normal in size with greater than 50%  respiratory variability, suggesting right atrial pressure of 3 mmHg.  12. The average left  ventricular global longitudinal strain is -19.6 %.    ASSESSMENT AND PLAN:  1.  Paroxysmal atrial fibrillation: CHA2DS2-VASc of 2.  Currently on Eliquis 5 mg twice daily.  Is status post ablation 10/17/2021.  Fortunately had more frequent episodes of atrial flutter as documented by ECG 11/14/2021.  He would prefer to remain in sinus rhythm and would like to avoid medication management.  Due to that, we Evva Din plan for ablation.  While doing this ablation, we Macauley Mossberg check his pulmonary veins to ensure that they remain isolated.  Risk, benefits, and alternatives to EP study and radiofrequency ablation for afib were also discussed in detail today. These risks include but are not limited to stroke, bleeding, vascular damage, tamponade, perforation, damage to the esophagus, lungs, and other structures, pulmonary vein stenosis, worsening renal function, and death. The patient understands these risk  and wishes to proceed.  We Jess Toney therefore proceed with catheter ablation at the next available time.  Carto, ICE, anesthesia are requested for the procedure.  Katharyn Schauer also obtain CT PV protocol prior to the procedure to exclude LAA thrombus and further evaluate atrial anatomy.   2.  Hyperlipidemia: Goal LDL less than 70.  Continue plan per primary cardiology.   Current medicines are reviewed at length with the patient today.   The patient does not have concerns regarding his medicines.  The following changes were made today:  none  Labs/ tests ordered today include:  Orders Placed This Encounter  Procedures   CT CARDIAC MORPH/PULM VEIN W/CM&W/O CA SCORE   Basic metabolic panel   CBC   EKG 12-Lead     Disposition:   FU with Nehemiah Mcfarren 3 months  Signed, Rebel Willcutt Meredith Leeds, MD  01/17/2022 2:56 PM     [facility] [address] [place] [address] [phone] (office) [phone] (fax)

## 2022-02-03 ENCOUNTER — Other Ambulatory Visit: Payer: Self-pay | Admitting: Cardiology

## 2022-03-28 ENCOUNTER — Encounter (INDEPENDENT_AMBULATORY_CARE_PROVIDER_SITE_OTHER): Payer: Self-pay

## 2022-04-15 ENCOUNTER — Other Ambulatory Visit: Payer: 59

## 2022-04-15 DIAGNOSIS — I4819 Other persistent atrial fibrillation: Secondary | ICD-10-CM

## 2022-04-15 DIAGNOSIS — Z01812 Encounter for preprocedural laboratory examination: Secondary | ICD-10-CM

## 2022-04-15 DIAGNOSIS — I483 Typical atrial flutter: Secondary | ICD-10-CM

## 2022-04-16 LAB — CBC
Hematocrit: 41.7 % (ref 37.5–51.0)
Hemoglobin: 14.8 g/dL (ref 13.0–17.7)
MCH: 34 pg — ABNORMAL HIGH (ref 26.6–33.0)
MCHC: 35.5 g/dL (ref 31.5–35.7)
MCV: 96 fL (ref 79–97)
Platelets: 216 10*3/uL (ref 150–450)
RBC: 4.35 x10E6/uL (ref 4.14–5.80)
RDW: 12.1 % (ref 11.6–15.4)
WBC: 5.6 10*3/uL (ref 3.4–10.8)

## 2022-04-16 LAB — BASIC METABOLIC PANEL
BUN/Creatinine Ratio: 14 (ref 9–20)
BUN: 13 mg/dL (ref 6–24)
CO2: 22 mmol/L (ref 20–29)
Calcium: 9.4 mg/dL (ref 8.7–10.2)
Chloride: 100 mmol/L (ref 96–106)
Creatinine, Ser: 0.93 mg/dL (ref 0.76–1.27)
Glucose: 117 mg/dL — ABNORMAL HIGH (ref 70–99)
Potassium: 4 mmol/L (ref 3.5–5.2)
Sodium: 136 mmol/L (ref 134–144)
eGFR: 98 mL/min/{1.73_m2} (ref >59)

## 2022-04-22 ENCOUNTER — Telehealth (HOSPITAL_COMMUNITY): Payer: Self-pay | Admitting: *Deleted

## 2022-04-22 NOTE — Telephone Encounter (Signed)
Attempted to call patient regarding upcoming cardiac CT appointment. °Left message on voicemail with name and callback number ° °Timothea Bodenheimer RN Navigator Cardiac Imaging °[facility] °[phone] Office °[phone] Cell ° °

## 2022-04-23 ENCOUNTER — Ambulatory Visit (HOSPITAL_COMMUNITY)
Admission: RE | Admit: 2022-04-23 | Discharge: 2022-04-23 | Disposition: A | Discharge status: Home / Self Care | Payer: 59 | Source: Ambulatory Visit | Attending: Cardiology | Admitting: Cardiology

## 2022-04-23 DIAGNOSIS — I4819 Other persistent atrial fibrillation: Secondary | ICD-10-CM | POA: Diagnosis present

## 2022-04-23 MED ORDER — IOHEXOL 350 MG/ML SOLN
100.0000 mL | Freq: Once | INTRAVENOUS | Status: AC | PRN
  Administered 2022-04-23: 100 mL via INTRAVENOUS

## 2022-04-26 NOTE — Pre-Procedure Instructions (Signed)
Instructed patient on the following items: Arrival time 0830 Nothing to eat or drink after midnight No meds AM of procedure Responsible person to drive you home and stay with you for 24 hrs  Have you missed any doses of anti-coagulant Eliquis- be sure to take all doses, you will not take your dose Tuesday morning dose before your procedure.

## 2022-04-30 ENCOUNTER — Ambulatory Visit (HOSPITAL_COMMUNITY)
Admission: RE | Admit: 2022-04-30 | Discharge: 2022-04-30 | Disposition: A | Discharge status: Home / Self Care | Payer: 59 | Source: Ambulatory Visit | Attending: Cardiology | Admitting: Cardiology

## 2022-04-30 ENCOUNTER — Ambulatory Visit (HOSPITAL_COMMUNITY): Payer: 59 | Admitting: Certified Registered Nurse Anesthetist

## 2022-04-30 ENCOUNTER — Ambulatory Visit (HOSPITAL_BASED_OUTPATIENT_CLINIC_OR_DEPARTMENT_OTHER): Payer: 59 | Admitting: Certified Registered Nurse Anesthetist

## 2022-04-30 ENCOUNTER — Other Ambulatory Visit: Payer: Self-pay

## 2022-04-30 ENCOUNTER — Ambulatory Visit (HOSPITAL_COMMUNITY)
Admission: RE | Disposition: A | Discharge status: Home / Self Care | Payer: Self-pay | Source: Ambulatory Visit | Attending: Cardiology

## 2022-04-30 DIAGNOSIS — F418 Other specified anxiety disorders: Secondary | ICD-10-CM

## 2022-04-30 DIAGNOSIS — I4892 Unspecified atrial flutter: Secondary | ICD-10-CM | POA: Insufficient documentation

## 2022-04-30 DIAGNOSIS — I480 Paroxysmal atrial fibrillation: Secondary | ICD-10-CM | POA: Insufficient documentation

## 2022-04-30 DIAGNOSIS — K219 Gastro-esophageal reflux disease without esophagitis: Secondary | ICD-10-CM | POA: Diagnosis not present

## 2022-04-30 DIAGNOSIS — I4891 Unspecified atrial fibrillation: Secondary | ICD-10-CM | POA: Diagnosis not present

## 2022-04-30 DIAGNOSIS — Z87891 Personal history of nicotine dependence: Secondary | ICD-10-CM | POA: Diagnosis not present

## 2022-04-30 DIAGNOSIS — I483 Typical atrial flutter: Secondary | ICD-10-CM | POA: Diagnosis not present

## 2022-04-30 HISTORY — PX: ATRIAL FIBRILLATION ABLATION: EP1191

## 2022-04-30 LAB — POCT ACTIVATED CLOTTING TIME: Activated Clotting Time: 299 seconds

## 2022-04-30 SURGERY — ATRIAL FIBRILLATION ABLATION
Anesthesia: General

## 2022-04-30 MED ORDER — DOBUTAMINE INFUSION FOR EP/ECHO/NUC (1000 MCG/ML)
INTRAVENOUS | Status: DC | PRN
  Administered 2022-04-30: 10 ug/kg/min via INTRAVENOUS

## 2022-04-30 MED ORDER — ACETAMINOPHEN 325 MG PO TABS
650.0000 mg | ORAL_TABLET | ORAL | Status: DC | PRN
  Administered 2022-04-30: 650 mg via ORAL
  Filled 2022-04-30: qty 2

## 2022-04-30 MED ORDER — HEPARIN (PORCINE) IN NACL 1000-0.9 UT/500ML-% IV SOLN
INTRAVENOUS | Status: AC
  Filled 2022-04-30: qty 500

## 2022-04-30 MED ORDER — HEPARIN (PORCINE) IN NACL 1000-0.9 UT/500ML-% IV SOLN
INTRAVENOUS | Status: AC
Start: 2022-04-30 — End: ?
  Filled 2022-04-30: qty 500

## 2022-04-30 MED ORDER — SODIUM CHLORIDE 0.9% FLUSH: 3.0000 mL | INTRAVENOUS | Status: DC | PRN

## 2022-04-30 MED ORDER — PROPOFOL 10 MG/ML IV BOLUS
INTRAVENOUS | Status: DC | PRN
  Administered 2022-04-30: 170 mg via INTRAVENOUS
  Administered 2022-04-30: 30 mg via INTRAVENOUS
  Administered 2022-04-30: 50 mg via INTRAVENOUS

## 2022-04-30 MED ORDER — ROCURONIUM BROMIDE 10 MG/ML (PF) SYRINGE
PREFILLED_SYRINGE | INTRAVENOUS | Status: DC | PRN
  Administered 2022-04-30: 60 mg via INTRAVENOUS
  Administered 2022-04-30: 30 mg via INTRAVENOUS

## 2022-04-30 MED ORDER — DEXAMETHASONE SODIUM PHOSPHATE 10 MG/ML IJ SOLN
INTRAMUSCULAR | Status: DC | PRN
  Administered 2022-04-30: 4 mg via INTRAVENOUS

## 2022-04-30 MED ORDER — MIDAZOLAM HCL 2 MG/2ML IJ SOLN
INTRAMUSCULAR | Status: DC | PRN
  Administered 2022-04-30: 2 mg via INTRAVENOUS

## 2022-04-30 MED ORDER — SUGAMMADEX SODIUM 200 MG/2ML IV SOLN
INTRAVENOUS | Status: DC | PRN
  Administered 2022-04-30: 320 mg via INTRAVENOUS

## 2022-04-30 MED ORDER — SODIUM CHLORIDE 0.9 % IV SOLN
250.0000 mL | INTRAVENOUS | Status: DC | PRN
Start: 2022-04-30 — End: 2022-04-30

## 2022-04-30 MED ORDER — SODIUM CHLORIDE 0.9% FLUSH: 3.0000 mL | Freq: Two times a day (BID) | INTRAVENOUS | Status: DC

## 2022-04-30 MED ORDER — SODIUM CHLORIDE 0.9 % IV SOLN: INTRAVENOUS | Status: DC

## 2022-04-30 MED ORDER — HEPARIN (PORCINE) IN NACL 2000-0.9 UNIT/L-% IV SOLN
INTRAVENOUS | Status: AC
  Filled 2022-04-30: qty 1000

## 2022-04-30 MED ORDER — HEPARIN SODIUM (PORCINE) 1000 UNIT/ML IJ SOLN
INTRAMUSCULAR | Status: DC | PRN
  Administered 2022-04-30: 1000 [IU] via INTRAVENOUS

## 2022-04-30 MED ORDER — ONDANSETRON HCL 4 MG/2ML IJ SOLN
INTRAMUSCULAR | Status: DC | PRN
  Administered 2022-04-30: 4 mg via INTRAVENOUS

## 2022-04-30 MED ORDER — LIDOCAINE 2% (20 MG/ML) 5 ML SYRINGE
INTRAMUSCULAR | Status: DC | PRN
  Administered 2022-04-30: 60 mg via INTRAVENOUS
  Administered 2022-04-30: 4 mg via INTRAVENOUS

## 2022-04-30 MED ORDER — HEPARIN (PORCINE) IN NACL 1000-0.9 UT/500ML-% IV SOLN
INTRAVENOUS | Status: DC | PRN
  Administered 2022-04-30 (×4): 500 mL

## 2022-04-30 MED ORDER — DOBUTAMINE INFUSION FOR EP/ECHO/NUC (1000 MCG/ML)
INTRAVENOUS | Status: AC
  Filled 2022-04-30: qty 250

## 2022-04-30 MED ORDER — HEPARIN SODIUM (PORCINE) 1000 UNIT/ML IJ SOLN
INTRAMUSCULAR | Status: DC | PRN
  Administered 2022-04-30: 14000 [IU] via INTRAVENOUS

## 2022-04-30 MED ORDER — ONDANSETRON HCL 4 MG/2ML IJ SOLN: 4.0000 mg | Freq: Four times a day (QID) | INTRAMUSCULAR | Status: DC | PRN

## 2022-04-30 MED ORDER — PROTAMINE SULFATE 10 MG/ML IV SOLN
INTRAVENOUS | Status: DC | PRN
  Administered 2022-04-30: 40 mg via INTRAVENOUS

## 2022-04-30 MED ORDER — FENTANYL CITRATE (PF) 100 MCG/2ML IJ SOLN
INTRAMUSCULAR | Status: DC | PRN
  Administered 2022-04-30: 100 ug via INTRAVENOUS

## 2022-04-30 SURGICAL SUPPLY — 19 items
BAG SNAP BAND KOVER 36X36 (MISCELLANEOUS) IMPLANT
CATH ABLAT QDOT MICRO BI TC DF (CATHETERS) IMPLANT
CATH OCTARAY 2.0 F 3-3-3-3-3 (CATHETERS) IMPLANT
CATH S CIRCA THERM PROBE 10F (CATHETERS) IMPLANT
CATH SOUNDSTAR ECO 8FR (CATHETERS) IMPLANT
CATH WEB BI DIR CSDF CRV REPRO (CATHETERS) IMPLANT
CLOSURE PERCLOSE PROSTYLE (VASCULAR PRODUCTS) IMPLANT
COVER SWIFTLINK CONNECTOR (BAG) ×2 IMPLANT
KIT VERSACROSS STEERABLE D1 (CATHETERS) IMPLANT
PACK EP LATEX FREE (CUSTOM PROCEDURE TRAY) ×1
PACK EP LF (CUSTOM PROCEDURE TRAY) ×2 IMPLANT
PAD DEFIB RADIO PHYSIO CONN (PAD) ×2 IMPLANT
PATCH CARTO3 (PAD) IMPLANT
SHEATH CARTO VIZIGO SM CVD (SHEATH) IMPLANT
SHEATH PINNACLE 7F 10CM (SHEATH) IMPLANT
SHEATH PINNACLE 8F 10CM (SHEATH) IMPLANT
SHEATH PINNACLE 9F 10CM (SHEATH) IMPLANT
SHEATH PROBE COVER 6X72 (BAG) IMPLANT
TUBING SMART ABLATE COOLFLOW (TUBING) IMPLANT

## 2022-04-30 NOTE — Anesthesia Postprocedure Evaluation (Signed)
Anesthesia Post Note  Patient: Andrew [name]  Procedure(s) Performed: ATRIAL FIBRILLATION ABLATION     Patient location during evaluation: PACU Anesthesia Type: General Level of consciousness: awake and alert Pain management: pain level controlled Vital Signs Assessment: post-procedure vital signs reviewed and stable Respiratory status: spontaneous breathing, nonlabored ventilation, respiratory function stable and patient connected to nasal cannula oxygen Cardiovascular status: blood pressure returned to baseline and stable Postop Assessment: no apparent nausea or vomiting Anesthetic complications: no   There were no known notable events for this encounter.  Last Vitals:  Vitals:   04/30/22 1210 04/30/22 1215  BP: 116/73 117/67  Pulse: (!) 55 (!) 55  Resp: 20 13  Temp:    SpO2: 96% 98%    Last Pain:  Vitals:   04/30/22 1208  TempSrc: Temporal  PainSc: 0-No pain                 Earl Lites P Shekela Goodridge

## 2022-04-30 NOTE — Transfer of Care (Signed)
Immediate Anesthesia Transfer of Care Note  Patient: Andrew [name]  Procedure(s) Performed: ATRIAL FIBRILLATION ABLATION  Patient Location: PACU  Anesthesia Type:General  Level of Consciousness: awake, alert  and oriented  Airway & Oxygen Therapy: Patient Spontanous Breathing  Post-op Assessment: Report given to RN, Post -op Vital signs reviewed and stable and Patient moving all extremities X 4  Post vital signs: Reviewed and stable  Last Vitals:  Vitals Value Taken Time  BP 106/67 04/30/22 1138  Temp    Pulse 54 04/30/22 1140  Resp 15 04/30/22 1140  SpO2 98 % 04/30/22 1140  Vitals shown include unvalidated device data.  Last Pain:  Vitals:   04/30/22 0913  TempSrc:   PainSc: 0-No pain         Complications: There were no known notable events for this encounter.

## 2022-04-30 NOTE — Anesthesia Preprocedure Evaluation (Signed)
Anesthesia Evaluation  Patient identified by MRN, date of birth, ID band Patient awake    Reviewed: Allergy & Precautions, NPO status , Patient's Chart, lab work & pertinent test results  Airway Mallampati: II  TM Distance: >3 FB Neck ROM: Full    Dental no notable dental hx.    Pulmonary neg pulmonary ROS, former smoker,    Pulmonary exam normal        Cardiovascular negative cardio ROS  + dysrhythmias Atrial Fibrillation  Rhythm:Irregular Rate:Normal     Neuro/Psych Anxiety Depression negative neurological ROS     GI/Hepatic Neg liver ROS, GERD  ,  Endo/Other  negative endocrine ROS  Renal/GU negative Renal ROS  negative genitourinary   Musculoskeletal negative musculoskeletal ROS (+)   Abdominal Normal abdominal exam  (+)   Peds  Hematology negative hematology ROS (+)   Anesthesia Other Findings   Reproductive/Obstetrics                             Anesthesia Physical Anesthesia Plan  ASA: 3  Anesthesia Plan: General   Post-op Pain Management:    Induction: Intravenous  PONV Risk Score and Plan: 2 and Ondansetron, Dexamethasone, Midazolam and Treatment may vary due to age or medical condition  Airway Management Planned: Mask and Oral ETT  Additional Equipment: None  Intra-op Plan:   Post-operative Plan: Extubation in OR  Informed Consent: I have reviewed the patients History and Physical, chart, labs and discussed the procedure including the risks, benefits and alternatives for the proposed anesthesia with the patient or authorized representative who has indicated his/her understanding and acceptance.     Dental advisory given  Plan Discussed with: CRNA  Anesthesia Plan Comments: (Lab Results      Component                Value               Date                      WBC                      5.6                 04/15/2022                HGB                       14.8                04/15/2022                HCT                      41.7                04/15/2022                MCV                      96                  04/15/2022                PLT  216                 04/15/2022           Lab Results      Component                Value               Date                      NA                       136                 04/15/2022                K                        4.0                 04/15/2022                CO2                      22                  04/15/2022                GLUCOSE                  117 (H)             04/15/2022                BUN                      13                  04/15/2022                CREATININE               0.93                04/15/2022                CALCIUM                  9.4                 04/15/2022                EGFR                     98                  04/15/2022                GFRNONAA                 >60                 01/10/2020          )        Anesthesia Quick Evaluation

## 2022-04-30 NOTE — Anesthesia Procedure Notes (Signed)
Procedure Name: Intubation Date/Time: 04/30/2022 10:09 AM  Performed by: Janace Litten, CRNAPre-anesthesia Checklist: Patient identified, Emergency Drugs available, Suction available and Patient being monitored Patient Re-evaluated:Patient Re-evaluated prior to induction Oxygen Delivery Method: Circle System Utilized Preoxygenation: Pre-oxygenation with 100% oxygen Induction Type: IV induction Ventilation: Mask ventilation without difficulty Laryngoscope Size: Mac and 4 Grade View: Grade I Tube type: Oral Tube size: 7.5 mm Number of attempts: 1 Airway Equipment and Method: Stylet Placement Confirmation: ETT inserted through vocal cords under direct vision, positive ETCO2 and breath sounds checked- equal and bilateral Secured at: 22 cm Tube secured with: Tape Dental Injury: Teeth and Oropharynx as per pre-operative assessment

## 2022-04-30 NOTE — Discharge Instructions (Signed)

## 2022-04-30 NOTE — H&P (Signed)
Electrophysiology Office Note   Date:  04/30/2022   ID:  GALDINO HINCHMAN, DOB 07-05-1971, MRN [id number]  PCP:  [facility]  Cardiologist:  Katrinka Blazing Primary Electrophysiologist:  Jillayne Witte [name], MD    Chief Complaint: AF   History of Present Illness: Andrew [name] is a 51 y.o. male who is being seen today for the evaluation of AF at the request of No ref. provider found. Presenting today for electrophysiology evaluation.  He has a history significant for atrial fibrillation, aortic sclerosis.  He has been having intermittent episodes of atrial fibrillation with heart rates in the 150s.  Episodes last between 4 to 8 hours.  He is now status post atrial fibrillation ablation 10/17/2021.  Today, denies symptoms of palpitations, chest pain, shortness of breath, orthopnea, PND, lower extremity edema, claudication, dizziness, presyncope, syncope, bleeding, or neurologic sequela. The patient is tolerating medications without difficulties. Plan AF ablation today.    Past Medical History:  Diagnosis Date   Anxiety    Depression    GERD (gastroesophageal reflux disease)    Past Surgical History:  Procedure Laterality Date   ATRIAL FIBRILLATION ABLATION N/A 10/17/2021   Procedure: ATRIAL FIBRILLATION ABLATION;  Surgeon: Regan Lemming, MD;  Location: [facility];  Service: Cardiovascular;  Laterality: N/A;   CHOLECYSTECTOMY N/A 07/11/2016   Procedure: LAPAROSCOPIC CHOLECYSTECTOMY;  Surgeon: Abigail Miyamoto, MD;  Location: [facility];  Service: General;  Laterality: N/A;   NASAL POLYP EXCISION     VASECTOMY       Current Facility-Administered Medications  Medication Dose Route Frequency Provider Last Rate Last Admin   0.9 %  sodium chloride infusion   Intravenous Continuous Regan Lemming, MD 50 mL/hr at 04/30/22 0916 New Bag at 04/30/22 0916    Allergies:   Patient has no known allergies.   Social History:  The patient  reports that he quit  smoking about 15 years ago. His smoking use included cigarettes. He has never used smokeless tobacco. He reports current alcohol use. He reports that he does not use drugs.   Family History:  The patient's family history includes Fainting in his maternal grandmother.   ROS:  Please see the history of present illness.   Otherwise, review of systems is positive for none.   All other systems are reviewed and negative.   PHYSICAL EXAM: VS:  BP 120/88   Pulse 60   Temp 97.8 F (36.6 C) (Temporal)   Resp 17   Ht 5\' 10"  (1.778 m)   Wt 79.4 kg   SpO2 95%   BMI 25.11 kg/m  , BMI Body mass index is 25.11 kg/m. GEN: Well nourished, well developed, in no acute distress  HEENT: normal  Neck: no JVD, carotid bruits, or masses Cardiac: RRR; no murmurs, rubs, or gallops,no edema  Respiratory:  clear to auscultation bilaterally, normal work of breathing GI: soft, nontender, nondistended, + BS MS: no deformity or atrophy  Skin: warm and dry Neuro:  Strength and sensation are intact Psych: euthymic mood, full affect  Recent Labs: 07/10/2021: ALT 22; TSH 1.17 04/15/2022: BUN 13; Creatinine, Ser 0.93; Hemoglobin 14.8; Platelets 216; Potassium 4.0; Sodium 136    Lipid Panel  No results found for: "CHOL", "TRIG", "HDL", "CHOLHDL", "VLDL", "LDLCALC", "LDLDIRECT"   Wt Readings from Last 3 Encounters:  04/30/22 79.4 kg  01/17/22 79.6 kg  11/14/21 81.5 kg      Other studies Reviewed: Additional studies/ records that were reviewed today include: TTE  07/29/19  Review of the above records today demonstrates:   1. Left ventricular ejection fraction, by 3D calculation, is 62%. The  left ventricle has normal function. There is no left ventricular  hypertrophy.   2. The left ventricle has no regional wall motion abnormalities.   3. Global right ventricle has normal systolic function.The right  ventricular size is normal. No increase in right ventricular wall  thickness.   4. Left atrial size was  normal.   5. Right atrial size was normal.   6. The mitral valve is normal in structure. Trivial mitral valve  regurgitation. No evidence of mitral stenosis.   7. The tricuspid valve is normal in structure. Tricuspid valve  regurgitation is trivial.   8. The aortic valve is tricuspid. Aortic valve regurgitation is trivial.  No evidence of aortic valve sclerosis or stenosis.   9. The pulmonic valve was normal in structure. Pulmonic valve  regurgitation is trivial.  10. Normal pulmonary artery systolic pressure.  11. The inferior vena cava is normal in size with greater than 50%  respiratory variability, suggesting right atrial pressure of 3 mmHg.  12. The average left ventricular global longitudinal strain is -19.6 %.    ASSESSMENT AND PLAN:  1.  Paroxysmal atrial fibrillation: ELMORE HYSLOP has presented today for surgery, with the diagnosis of AF.  The various methods of treatment have been discussed with the patient and family. After consideration of risks, benefits and other options for treatment, the patient has consented to  Procedure(s): Catheter ablation as a surgical intervention .  Risks include but not limited to complete heart block, stroke, esophageal damage, nerve damage, bleeding, vascular damage, tamponade, perforation, MI, and death. The patient's history has been reviewed, patient examined, no change in status, stable for surgery.  I have reviewed the patient's chart and labs.  Questions were answered to the patient's satisfaction.    Jaylinn Hellenbrand Elberta Fortis, MD 04/30/2022 9:24 AM

## 2022-05-01 ENCOUNTER — Encounter (HOSPITAL_COMMUNITY): Payer: Self-pay | Admitting: Cardiology

## 2022-05-01 MED FILL — Heparin Sod (Porcine)-NaCl IV Soln 1000 Unit/500ML-0.9%: INTRAVENOUS | Qty: 500 | Status: AC

## 2022-05-28 ENCOUNTER — Ambulatory Visit (HOSPITAL_COMMUNITY)
Admission: RE | Admit: 2022-05-28 | Discharge: 2022-05-28 | Disposition: A | Discharge status: Home / Self Care | Payer: 59 | Source: Ambulatory Visit | Attending: Nurse Practitioner | Admitting: Nurse Practitioner

## 2022-05-28 ENCOUNTER — Encounter (HOSPITAL_COMMUNITY): Payer: Self-pay | Admitting: Physician Assistant

## 2022-05-28 VITALS — BP 108/82 | HR 64 | Ht 70.0 in | Wt 175.2 lb

## 2022-05-28 DIAGNOSIS — Z79899 Other long term (current) drug therapy: Secondary | ICD-10-CM | POA: Insufficient documentation

## 2022-05-28 DIAGNOSIS — I483 Typical atrial flutter: Secondary | ICD-10-CM | POA: Insufficient documentation

## 2022-05-28 DIAGNOSIS — Z87891 Personal history of nicotine dependence: Secondary | ICD-10-CM | POA: Diagnosis not present

## 2022-05-28 DIAGNOSIS — R002 Palpitations: Secondary | ICD-10-CM | POA: Insufficient documentation

## 2022-05-28 DIAGNOSIS — Z9049 Acquired absence of other specified parts of digestive tract: Secondary | ICD-10-CM | POA: Diagnosis not present

## 2022-05-28 DIAGNOSIS — Z7901 Long term (current) use of anticoagulants: Secondary | ICD-10-CM | POA: Diagnosis not present

## 2022-05-28 DIAGNOSIS — I480 Paroxysmal atrial fibrillation: Secondary | ICD-10-CM | POA: Diagnosis not present

## 2022-05-28 NOTE — Progress Notes (Signed)
Primary Care Physician: [name] Referring Physician: Dr. Curt Bears Cardiologist: Dr. Vinetta [name] is a 51 y.o. male with a h/o  afib ablation one month ago. He had several in and out episode in the first day or two. From  then no other episode  until 3/8. Today in the office, EKG showed what appeared to be typical atrial flutter  2:1 at 150 bpm and then would return to SR. On auscultation he was in SR. His  apple watch correlates with this. Otherwise, no swallowing or groin issues. He is back to the gym exercising without any arrhythmia. He continues on eliquis.   Follow up in the AF clinic 05/28/22. Patient is s/p repeat afib and flutter ablation with Dr Curt Bears on 04/30/22. Patient reports that he will still have brief "fluttering" 2-3 times daily. The events are so brief that he doesn't have time to capture them on Kardia or his Apple Watch. He is exercising but slow to regain his stamina post ablation. No chest pain, swallowing pain, or groin issues.   Today, he denies symptoms of chest pain, shortness of breath, orthopnea, PND, lower extremity edema, dizziness, presyncope, syncope, or neurologic sequela. The patient is tolerating medications without difficulties and is otherwise without complaint today.   Past Medical History:  Diagnosis Date   Anxiety    Depression    GERD (gastroesophageal reflux disease)    Past Surgical History:  Procedure Laterality Date   ATRIAL FIBRILLATION ABLATION N/A 10/17/2021   Procedure: ATRIAL FIBRILLATION ABLATION;  Surgeon: Constance Haw, MD;  Location: [facility];  Service: Cardiovascular;  Laterality: N/A;   ATRIAL FIBRILLATION ABLATION N/A 04/30/2022   Procedure: ATRIAL FIBRILLATION ABLATION;  Surgeon: Constance Haw, MD;  Location: [facility];  Service: Cardiovascular;  Laterality: N/A;   CHOLECYSTECTOMY N/A 07/11/2016   Procedure: LAPAROSCOPIC CHOLECYSTECTOMY;  Surgeon: Coralie Keens,  MD;  Location: [facility];  Service: General;  Laterality: N/A;   NASAL POLYP EXCISION     VASECTOMY      Current Outpatient Medications  Medication Sig Dispense Refill   apixaban (ELIQUIS) 5 MG TABS tablet Take 1 tablet (5 mg total) by mouth 2 (two) times daily. 60 tablet 11   b complex vitamins capsule Take 1 capsule by mouth daily.     Coenzyme Q10 (CO Q 10) 10 MG CAPS Take 1 capsule by mouth daily.     diltiazem (CARDIZEM CD) 180 MG 24 hr capsule TAKE 1 CAPSULE BY MOUTH EVERY DAY 90 capsule 3   diltiazem (CARDIZEM) 30 MG tablet Take 1 tablet every 4 hours AS NEEDED for AFIB heart rate >100 45 tablet 1   escitalopram (LEXAPRO) 10 MG tablet Take 10 mg by mouth daily.     fluticasone (FLONASE) 50 MCG/ACT nasal spray Place 2 sprays into both nostrils daily.     LORazepam (ATIVAN) 1 MG tablet Take 0.5 mg by mouth 2 (two) times daily as needed for anxiety.     Multiple Vitamin (MULTIVITAMIN WITH MINERALS) TABS tablet Take 1 tablet by mouth daily.     Turmeric (CURCUMIN 95 PO) Take 2 tablets by mouth daily.     vitamin C (ASCORBIC ACID) 500 MG tablet Take 500 mg by mouth daily.     No current facility-administered medications for this encounter.    No Known Allergies  Social History   Socioeconomic History   Marital status: Married    Spouse name: Not on file  Number of children: Not on file   Years of education: Not on file   Highest education level: Not on file  Occupational History   Not on file  Tobacco Use   Smoking status: Former    Types: Cigarettes    Quit date: 07/11/2006    Years since quitting: 15.8   Smokeless tobacco: Never   Tobacco comments:    Former smoker stopped 10 years ago 05/28/22  Substance and Sexual Activity   Alcohol use: Yes    Alcohol/week: 15.0 standard drinks of alcohol    Types: 15 Standard drinks or equivalent per week    Comment: 2-3 drinks daily 05/28/22   Drug use: No   Sexual activity: Not on file  Other Topics Concern   Not on file   Social History Narrative   Not on file   Social Determinants of Health   Financial Resource Strain: Not on file  Food Insecurity: Not on file  Transportation Needs: Not on file  Physical Activity: Not on file  Stress: Not on file  Social Connections: Not on file  Intimate Partner Violence: Not on file    Family History  Problem Relation Age of Onset   Fainting Maternal Grandmother     ROS- All systems are reviewed and negative except as per the HPI above  Physical Exam: Vitals:   05/28/22 1108  BP: 108/82  Pulse: 64  Weight: 79.5 kg  Height: 5\' 10"  (1.778 m)    Wt Readings from Last 3 Encounters:  05/28/22 79.5 kg  04/30/22 79.4 kg  01/17/22 79.6 kg    Labs: Lab Results  Component Value Date   NA 136 04/15/2022   K 4.0 04/15/2022   CL 100 04/15/2022   CO2 22 04/15/2022   GLUCOSE 117 (H) 04/15/2022   BUN 13 04/15/2022   CREATININE 0.93 04/15/2022   CALCIUM 9.4 04/15/2022   No results found for: "INR" No results found for: "CHOL", "HDL", "[place]", "TRIG"   GEN- The patient is a well appearing male, alert and oriented x 3 today.   HEENT-head normocephalic, atraumatic, sclera clear, conjunctiva pink, hearing intact, trachea midline. Lungs- Clear to ausculation bilaterally, normal work of breathing Heart- Regular rate and rhythm, no murmurs, rubs or gallops  GI- soft, NT, ND, + BS Extremities- no clubbing, cyanosis, or edema MS- no significant deformity or atrophy Skin- no rash or lesion Psych- euthymic mood, full affect Neuro- strength and sensation are intact   EKG- SR Vent. rate 64 BPM PR interval 164 ms QRS duration 96 ms QT/QTcB 400/412 ms   Assessment and Plan:  1. Paroxysmal atrial fibrillation/typical atrial flutter S/p afib ablation 10/17/21 with repeat afib and flutter ablaiton 04/30/22 Patient having frequent but brief palpitations. Hopefully this will continue to improve post ablation.  Continue Eliquis 5 mg BID with no missed doses  for 3 months post ablation.  Continue diltiazem 180 mg daily with 30 mg PRN for heart racing.   2. CHA2DS2VASc  score of 0 Continue eliquis 5 mg bid through out the 3 month recovery period without interruption   Follow up with Dr Curt Bears as scheduled.    [facility] [address] [place], [place] [address] [phone]

## 2022-06-11 ENCOUNTER — Other Ambulatory Visit: Payer: Self-pay | Admitting: Interventional Cardiology

## 2022-06-11 NOTE — Telephone Encounter (Signed)
Prescription refill request for Eliquis received. Indication:Afib Last office visit:5/23 Scr:0.9 Age: 51 Weight:79.5 kg  Prescription refilled

## 2022-07-23 NOTE — Progress Notes (Unsigned)
Cardiology Office Note:    Date:  07/23/2022   ID:  Andrew [name], DOB [birth year], MRN [id number]  PCP:  [facility]  Cardiologist:  Lesleigh Noe, MD   Referring MD: [facility]*   No chief complaint on file.   History of Present Illness:    Andrew [name] is a 51 y.o. male with a hx of PAF (lasts up to 12 hours), aortic atherosclerosis, not anticoagulated (CHA2DS2-VASc score of 1), and atrial fibrillation ablation 04/30/2022 (Camnitz).  ***  Past Medical History:  Diagnosis Date   Anxiety    Depression    GERD (gastroesophageal reflux disease)     Past Surgical History:  Procedure Laterality Date   ATRIAL FIBRILLATION ABLATION N/A 10/17/2021   Procedure: ATRIAL FIBRILLATION ABLATION;  Surgeon: Regan Lemming, MD;  [facility];  Service: Cardiovascular;  Laterality: N/A;   ATRIAL FIBRILLATION ABLATION N/A 04/30/2022   Procedure: ATRIAL FIBRILLATION ABLATION;  Surgeon: Regan Lemming, MD;  [facility];  Service: Cardiovascular;  Laterality: N/A;   CHOLECYSTECTOMY N/A 07/11/2016   Procedure: LAPAROSCOPIC CHOLECYSTECTOMY;  Surgeon: Abigail Miyamoto, MD;  Location: [facility];  Service: General;  Laterality: N/A;   NASAL POLYP EXCISION     VASECTOMY      Current Medications: No outpatient medications have been marked as taking for the 07/25/22 encounter (Appointment) with Lyn Records, MD.     Allergies:   Patient has no known allergies.   Social History   Socioeconomic History   Marital status: Married    Spouse name: Not on file   Number of children: Not on file   Years of education: Not on file   Highest education level: Not on file  Occupational History   Not on file  Tobacco Use   Smoking status: Former    Types: Cigarettes    Quit date: 07/11/2006    Years since quitting: 16.0   Smokeless tobacco: Never   Tobacco comments:    Former smoker stopped 10 years ago 05/28/22   Substance and Sexual Activity   Alcohol use: Yes    Alcohol/week: 15.0 standard drinks of alcohol    Types: 15 Standard drinks or equivalent per week    Comment: 2-3 drinks daily 05/28/22   Drug use: No   Sexual activity: Not on file  Other Topics Concern   Not on file  Social History Narrative   Not on file   Social Determinants of Health   Financial Resource Strain: Not on file  Food Insecurity: Not on file  Transportation Needs: Not on file  Physical Activity: Not on file  Stress: Not on file  Social Connections: Not on file     Family History: The patient's family history includes Fainting in his maternal grandmother.  ROS:   Please see the history of present illness.    *** All other systems reviewed and are negative.  EKGs/Labs/Other Studies Reviewed:    The following studies were reviewed today: ***  EKG:  EKG ***  Recent Labs: 04/15/2022: BUN 13; Creatinine, Ser 0.93; Hemoglobin 14.8; Platelets 216; Potassium 4.0; Sodium 136  Recent Lipid Panel No results found for: "CHOL", "TRIG", "HDL", "CHOLHDL", "VLDL", "LDLCALC", "LDLDIRECT"  Physical Exam:    VS:  There were no vitals taken for this visit.    Wt Readings from Last 3 Encounters:  05/28/22 175 lb 3.2 oz (79.5 kg)  04/30/22 175 lb (79.4 kg)  01/17/22 175  lb 6.4 oz (79.6 kg)     GEN: ***. No acute distress HEENT: Normal NECK: No JVD. LYMPHATICS: No lymphadenopathy CARDIAC: *** murmur. RRR *** gallop, or edema. VASCULAR: *** Normal Pulses. No bruits. RESPIRATORY:  Clear to auscultation without rales, wheezing or rhonchi  ABDOMEN: Soft, non-tender, non-distended, No pulsatile mass, MUSCULOSKELETAL: No deformity  SKIN: Warm and dry NEUROLOGIC:  Alert and oriented x 3 PSYCHIATRIC:  Normal affect   ASSESSMENT:    1. Paroxysmal atrial fibrillation (HCC)   2. Typical atrial flutter (HCC)   3. Secondary hypercoagulable state (HCC)   4. Hyperlipidemia LDL goal <70   5. Aortic atherosclerosis  (HCC)    PLAN:    In order of problems listed above:  ***   Medication Adjustments/Labs and Tests Ordered: Current medicines are reviewed at length with the patient today.  Concerns regarding medicines are outlined above.  No orders of the defined types were placed in this encounter.  No orders of the defined types were placed in this encounter.   There are no Patient Instructions on file for this visit.   Signed, Lesleigh Noe, MD  07/23/2022 4:38 PM    [facility]

## 2022-07-24 ENCOUNTER — Ambulatory Visit: Payer: 59 | Admitting: Interventional Cardiology

## 2022-07-25 ENCOUNTER — Ambulatory Visit: Payer: 59 | Attending: Interventional Cardiology | Admitting: Interventional Cardiology

## 2022-07-25 ENCOUNTER — Encounter: Payer: Self-pay | Admitting: Interventional Cardiology

## 2022-07-25 VITALS — BP 110/74 | HR 66 | Ht 70.0 in | Wt 174.0 lb

## 2022-07-25 DIAGNOSIS — I700 Atherosclerosis of aorta: Secondary | ICD-10-CM

## 2022-07-25 DIAGNOSIS — E785 Hyperlipidemia, unspecified: Secondary | ICD-10-CM | POA: Diagnosis not present

## 2022-07-25 DIAGNOSIS — I2584 Coronary atherosclerosis due to calcified coronary lesion: Secondary | ICD-10-CM

## 2022-07-25 DIAGNOSIS — D6869 Other thrombophilia: Secondary | ICD-10-CM | POA: Diagnosis not present

## 2022-07-25 DIAGNOSIS — I483 Typical atrial flutter: Secondary | ICD-10-CM

## 2022-07-25 DIAGNOSIS — I480 Paroxysmal atrial fibrillation: Secondary | ICD-10-CM | POA: Diagnosis not present

## 2022-07-25 DIAGNOSIS — R0683 Snoring: Secondary | ICD-10-CM

## 2022-07-25 DIAGNOSIS — I2510 Atherosclerotic heart disease of native coronary artery without angina pectoris: Secondary | ICD-10-CM

## 2022-07-25 MED ORDER — ROSUVASTATIN CALCIUM 10 MG PO TABS: 10.0000 mg | ORAL_TABLET | Freq: Every day | ORAL | 3 refills | Status: DC

## 2022-07-25 NOTE — Patient Instructions (Signed)
Medication Instructions:  Your physician has recommended you make the following change in your medication:   1) START Rosuvastatin (Crestor) 10mg  daily  *If you need a refill on your cardiac medications before your next appointment, please call your pharmacy*  Lab Work: In 6 weeks: fasting Liver and Lipid panel If you have labs (blood work) drawn today and your tests are completely normal, you will receive your results only by: MyChart Message (if you have MyChart) OR A paper copy in the mail If you have any lab test that is abnormal or we need to change your treatment, we will call you to review the results.  Testing/Procedures: Your physician has requested that you have a home sleep study performed. This will be arranged through [facility], their sleep center will contact you to set this up.  Follow-Up: At [facility], you and your health needs are our priority.  As part of our continuing mission to provide you with exceptional heart care, we have created designated Provider Care Teams.  These Care Teams include your primary Cardiologist (physician) and Advanced Practice Providers (APPs -  Physician Assistants and Nurse Practitioners) who all work together to provide you with the care you need, when you need it.  Your next appointment:   6-8 month(s)  The format for your next appointment:   In Person  Provider:   [facility], MD or Donato Schultz, MD or Riley Lam, MD  Important Information About Sugar

## 2022-08-01 ENCOUNTER — Encounter: Payer: Self-pay | Admitting: Cardiology

## 2022-08-01 ENCOUNTER — Ambulatory Visit: Payer: 59 | Attending: Cardiology | Admitting: Cardiology

## 2022-08-01 VITALS — BP 114/70 | HR 63 | Wt 174.8 lb

## 2022-08-01 DIAGNOSIS — I480 Paroxysmal atrial fibrillation: Secondary | ICD-10-CM

## 2022-08-01 NOTE — Progress Notes (Signed)
Electrophysiology Office Note   Date:  08/01/2022   ID:  Andrew [name], DOB [birth year], MRN [id number]  PCP:  [facility]  Cardiologist:  Katrinka Blazing Primary Electrophysiologist:  Mckinnley Cottier [name], MD    Chief Complaint: AF   History of Present Illness: Andrew [name] is a 51 y.o. male who is being seen today for the evaluation of AF at the request of Pa, [facility] Presenting today for electrophysiology evaluation.  History significant atrial fibrillation and aortic sclerosis.  He had been having intermittent episodes of atrial fibrillation with heart rates in the 150s.  He is post ablation 09/27/2021 with repeat ablation 04/30/2022.  Today, denies symptoms of palpitations, chest pain, shortness of breath, orthopnea, PND, lower extremity edema, claudication, dizziness, presyncope, syncope, bleeding, or neurologic sequela. The patient is tolerating medications without difficulties.  Since his ablation he has had 3 episodes of atrial fibrillation.  They have lasted approximately an hour.  He is taking a as needed dose of diltiazem which she thinks has improved his symptoms.  He continues to exercise.  He did have 2 glasses of wine prior to 1 episode of A-fib.   Past Medical History:  Diagnosis Date   Anxiety    Depression    GERD (gastroesophageal reflux disease)    Past Surgical History:  Procedure Laterality Date   ATRIAL FIBRILLATION ABLATION N/A 10/17/2021   Procedure: ATRIAL FIBRILLATION ABLATION;  Surgeon: Regan Lemming, MD;  [facility];  Service: Cardiovascular;  Laterality: N/A;   ATRIAL FIBRILLATION ABLATION N/A 04/30/2022   Procedure: ATRIAL FIBRILLATION ABLATION;  Surgeon: Regan Lemming, MD;  [facility];  Service: Cardiovascular;  Laterality: N/A;   CHOLECYSTECTOMY N/A 07/11/2016   Procedure: LAPAROSCOPIC CHOLECYSTECTOMY;  Surgeon: Abigail Miyamoto, MD;  Location: [facility];  Service: General;   Laterality: N/A;   NASAL POLYP EXCISION     VASECTOMY       Current Outpatient Medications  Medication Sig Dispense Refill   diltiazem (CARDIZEM CD) 180 MG 24 hr capsule TAKE 1 CAPSULE BY MOUTH EVERY DAY 90 capsule 3   diltiazem (CARDIZEM) 30 MG tablet Take 1 tablet every 4 hours AS NEEDED for AFIB heart rate >100 45 tablet 1   ELIQUIS 5 MG TABS tablet TAKE 1 TABLET BY MOUTH TWICE A DAY 60 tablet 11   escitalopram (LEXAPRO) 10 MG tablet Take 10 mg by mouth daily.     fluticasone (FLONASE) 50 MCG/ACT nasal spray Place 2 sprays into both nostrils daily.     LORazepam (ATIVAN) 1 MG tablet Take 0.5 mg by mouth 2 (two) times daily as needed for anxiety.     Multiple Vitamin (MULTIVITAMIN WITH MINERALS) TABS tablet Take 1 tablet by mouth daily.     Omega-3 Fatty Acids (FISH OIL PO) Take 1 capsule by mouth every other day.     rosuvastatin (CRESTOR) 10 MG tablet Take 1 tablet (10 mg total) by mouth daily. 90 tablet 3   Turmeric (CURCUMIN 95 PO) Take 2 tablets by mouth daily.     vitamin C (ASCORBIC ACID) 500 MG tablet Take 500 mg by mouth daily.     No current facility-administered medications for this visit.    Allergies:   Patient has no known allergies.   Social History:  The patient  reports that he quit smoking about 16 years ago. His smoking use included cigarettes. He has never used smokeless tobacco. He reports current alcohol use of about  15.0 standard drinks of alcohol per week. He reports that he does not use drugs.   Family History:  The patient's family history includes Fainting in his maternal grandmother.   ROS:  Please see the history of present illness.   Otherwise, review of systems is positive for none.   All other systems are reviewed and negative.   PHYSICAL EXAM: VS:  BP 114/70   Pulse 63   Wt 174 lb 12.8 oz (79.3 kg)   SpO2 100%   BMI 25.08 kg/m  , BMI Body mass index is 25.08 kg/m. GEN: Well nourished, well developed, in no acute distress  HEENT: normal   Neck: no JVD, carotid bruits, or masses Cardiac: RRR; no murmurs, rubs, or gallops,no edema  Respiratory:  clear to auscultation bilaterally, normal work of breathing GI: soft, nontender, nondistended, + BS MS: no deformity or atrophy  Skin: warm and dry Neuro:  Strength and sensation are intact Psych: euthymic mood, full affect  EKG:  EKG is ordered today. Personal review of the ekg ordered shows sinus rhythm   Recent Labs: 04/15/2022: BUN 13; Creatinine, Ser 0.93; Hemoglobin 14.8; Platelets 216; Potassium 4.0; Sodium 136    Lipid Panel  No results found for: "CHOL", "TRIG", "HDL", "CHOLHDL", "VLDL", "LDLCALC", "LDLDIRECT"   Wt Readings from Last 3 Encounters:  08/01/22 174 lb 12.8 oz (79.3 kg)  07/25/22 174 lb (78.9 kg)  05/28/22 175 lb 3.2 oz (79.5 kg)      Other studies Reviewed: Additional studies/ records that were reviewed today include: TTE 07/29/19  Review of the above records today demonstrates:   1. Left ventricular ejection fraction, by 3D calculation, is 62%. The  left ventricle has normal function. There is no left ventricular  hypertrophy.   2. The left ventricle has no regional wall motion abnormalities.   3. Global right ventricle has normal systolic function.The right  ventricular size is normal. No increase in right ventricular wall  thickness.   4. Left atrial size was normal.   5. Right atrial size was normal.   6. The mitral valve is normal in structure. Trivial mitral valve  regurgitation. No evidence of mitral stenosis.   7. The tricuspid valve is normal in structure. Tricuspid valve  regurgitation is trivial.   8. The aortic valve is tricuspid. Aortic valve regurgitation is trivial.  No evidence of aortic valve sclerosis or stenosis.   9. The pulmonic valve was normal in structure. Pulmonic valve  regurgitation is trivial.  10. Normal pulmonary artery systolic pressure.  11. The inferior vena cava is normal in size with greater than 50%   respiratory variability, suggesting right atrial pressure of 3 mmHg.  12. The average left ventricular global longitudinal strain is -19.6 %.    ASSESSMENT AND PLAN:  Paroxysmal atrial fibrillation: CHA2DS2-VASc of 2.  Currently on Eliquis 5 mg twice daily.  Status post ablation 10/17/2021 with repeat ablation 04/30/2022.  He has had a few short episodes of atrial fibrillation.  Despite that, he is overall happy with his control.  Alcohol may be a trigger.  Andrew [name] continue with current management.  Andrew [name] keep his Eliquis.  2.  Hyperlipidemia: Goal LDL less than 70.  Continue plan per primary cardiology   Current medicines are reviewed at length with the patient today.   The patient does not have concerns regarding his medicines.  The following changes were made today:  none  Labs/ tests ordered today include:  Orders Placed This Encounter  Procedures  EKG 12-Lead     Disposition:   FU 6 months  Signed, Walker Sitar [name], MD  08/01/2022 3:39 PM     [facility] [address] [place] [address] [phone] (office) [phone] (fax)

## 2022-08-01 NOTE — Patient Instructions (Signed)
Medication Instructions:  Your physician recommends that you continue on your current medications as directed. Please refer to the Current Medication list given to you today.  *If you need a refill on your cardiac medications before your next appointment, please call your pharmacy*   Lab Work: None ordered   Testing/Procedures: None ordered   Follow-Up: At [facility], you and your health needs are our priority.  As part of our continuing mission to provide you with exceptional heart care, we have created designated Provider Care Teams.  These Care Teams include your primary Cardiologist (physician) and Advanced Practice Providers (APPs -  Physician Assistants and Nurse Practitioners) who all work together to provide you with the care you need, when you need it.  Your next appointment:   6 month(s)  The format for your next appointment:   In Person  Provider:   Will Camnitz, MD    Thank you for choosing [facility]!!   Carsynn Bethune, RN [phone]  Other Instructions   Important Information About Sugar           

## 2022-08-26 DIAGNOSIS — R42 Dizziness and giddiness: Secondary | ICD-10-CM | POA: Insufficient documentation

## 2022-09-03 ENCOUNTER — Telehealth: Payer: Self-pay | Admitting: Interventional Cardiology

## 2022-09-03 NOTE — Telephone Encounter (Signed)
Pt wife calling for an update on home sleep study

## 2022-09-05 ENCOUNTER — Other Ambulatory Visit: Payer: 59

## 2022-09-06 NOTE — Telephone Encounter (Signed)
Called patient and informed him his precert is still pending. Call was completed.

## 2022-09-17 IMAGING — US US THYROID
1 series · 13 of 25 positions shown · non-contrast
Comparison: None.

CLINICAL DATA: Abnormal thyroid labs

EXAM:
THYROID ULTRASOUND
TECHNIQUE: Ultrasound examination of the thyroid gland and adjacent soft
tissues was performed.

[Series 1: us thyroid · 0.06mm/px · 13 of 47 slices shown]
[im 1/47]
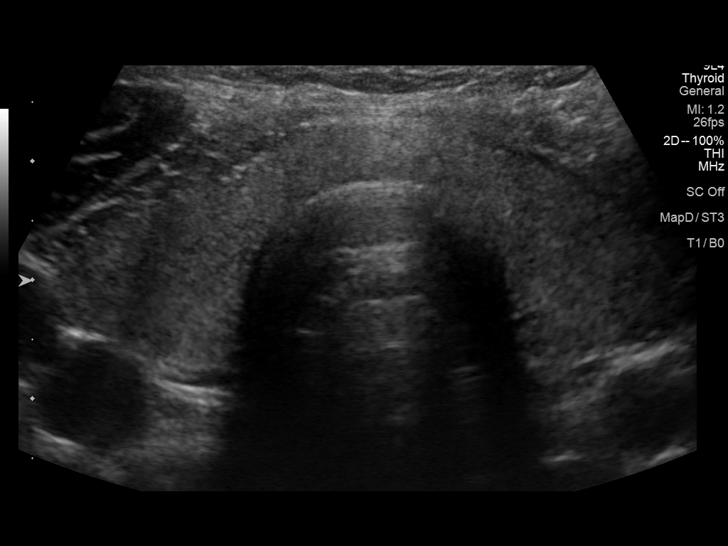
[im 4/47]
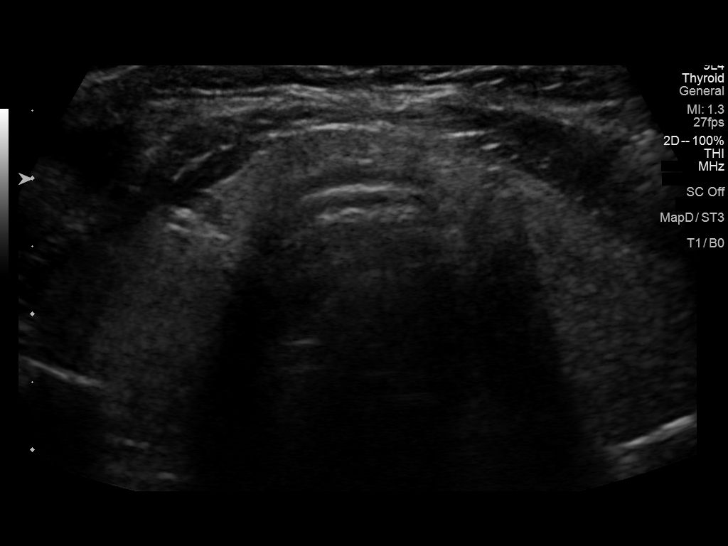
[im 8/47]
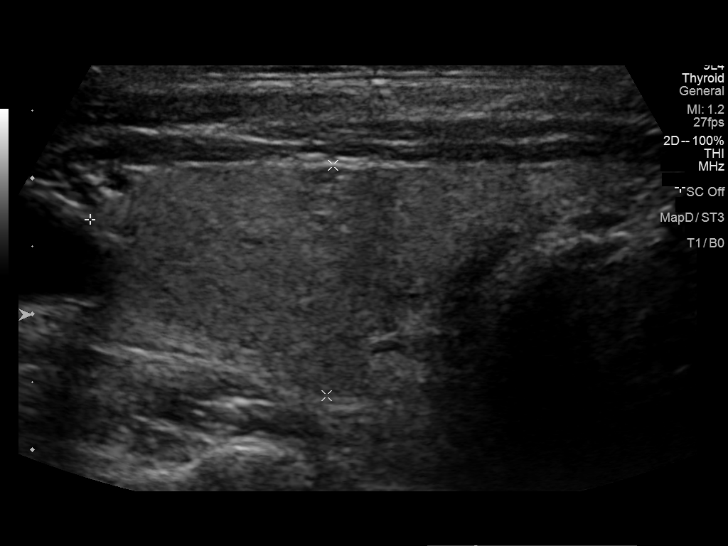
[im 12/47]
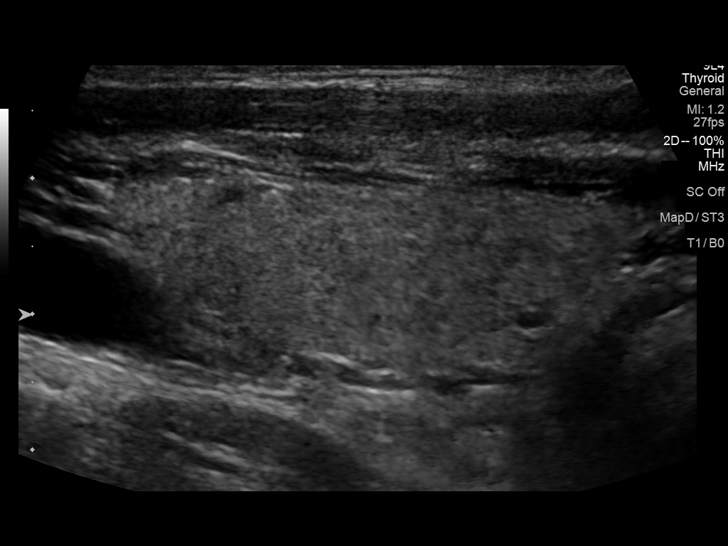
[im 16/47]
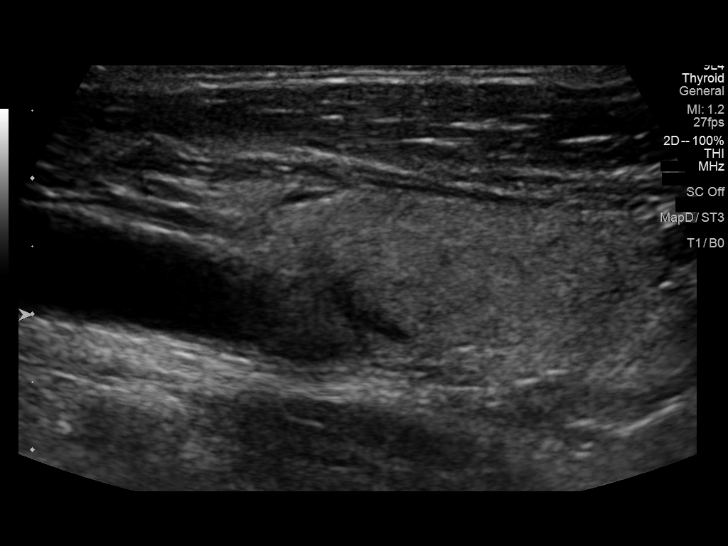
[im 20/47]
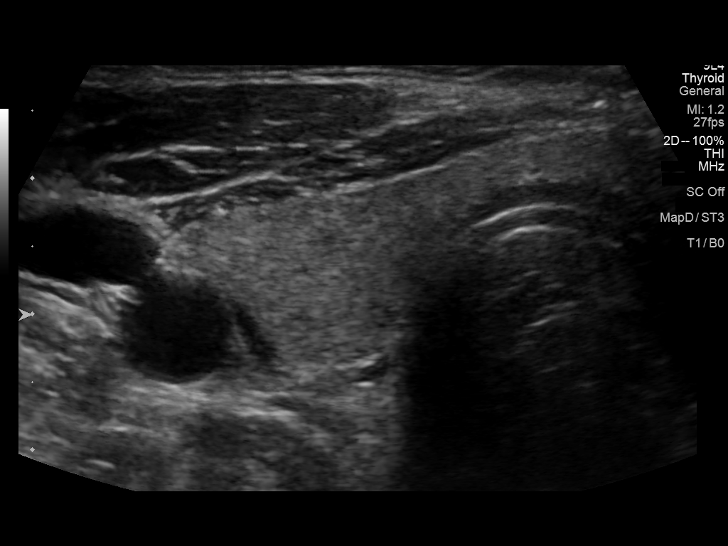
[im 24/47]
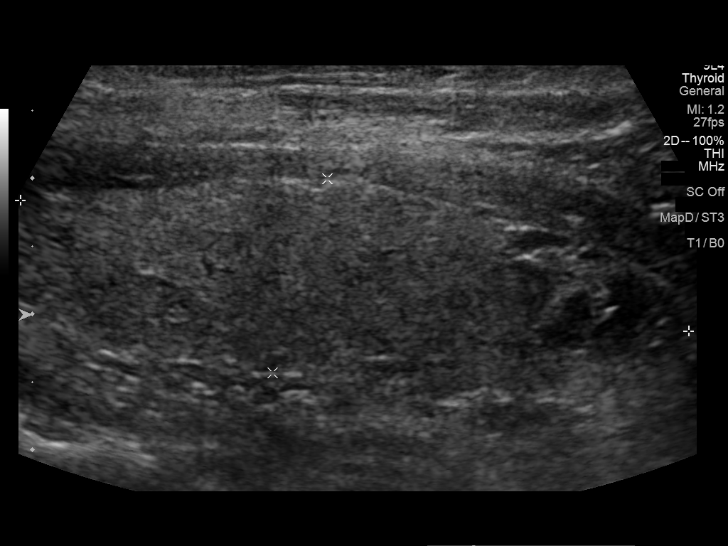
[im 27/47]
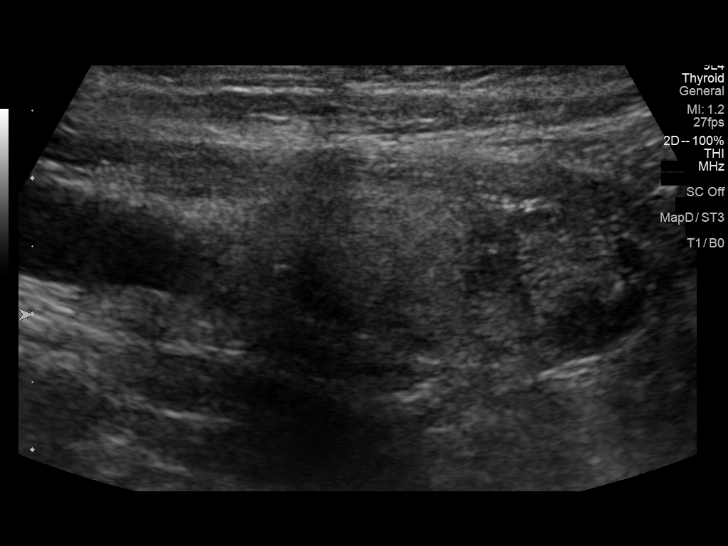
[im 31/47]
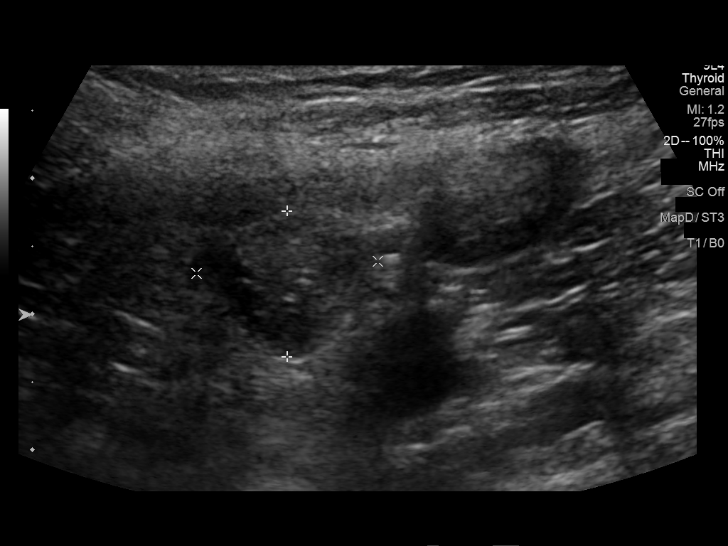
[im 35/47]
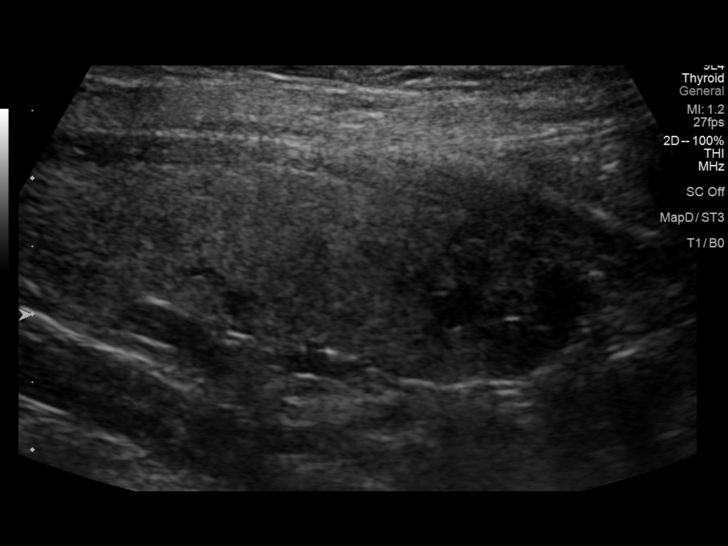
[im 39/47]
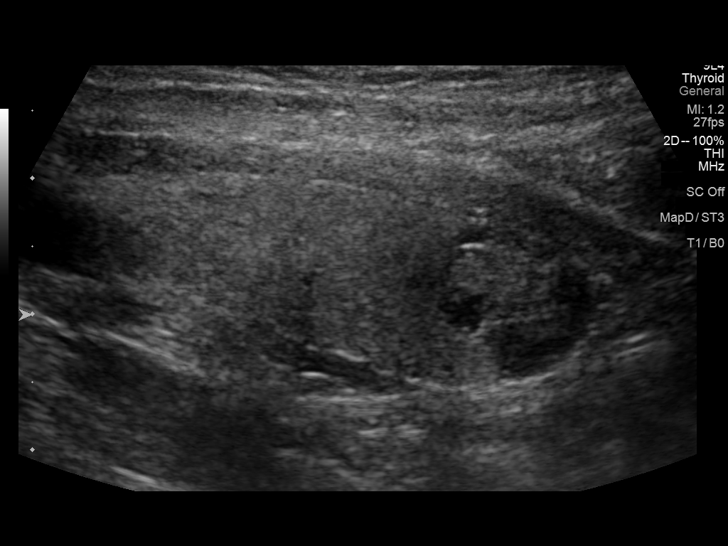
[im 43/47]
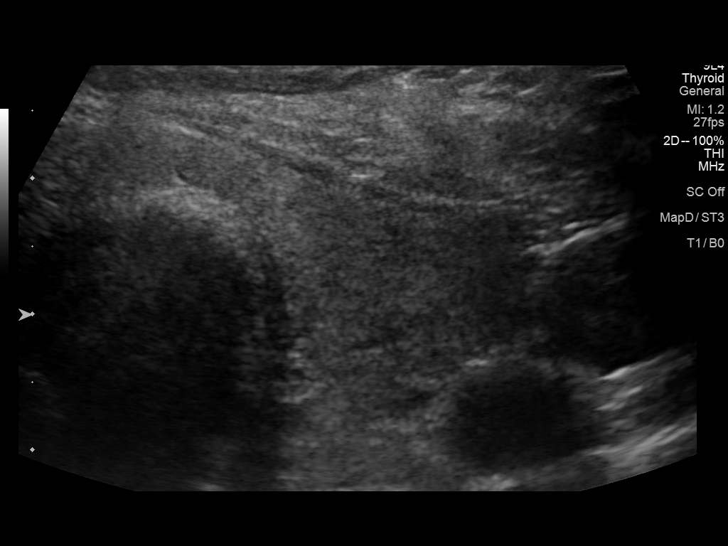
[im 47/47]
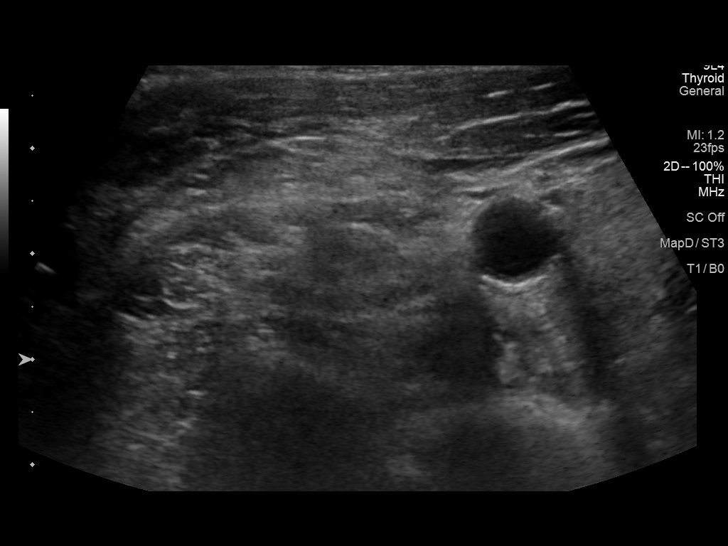

[13 of 25 positions shown; findings below may reference images not displayed]

FINDINGS: Parenchymal Echotexture: Mildly heterogenous

Isthmus: 0.5 cm

Right lobe: 4.4 x 1.7 x 1.9 cm

Left lobe: 5.0 x 1.5 by 1.9 cm

_________________________________________________________

Estimated total number of nodules >/= 1 cm: 1

Number of spongiform nodules >/=  2 cm not described below (TR1): 0

Number of mixed cystic and solid nodules >/= 1.5 cm not described
below (TR2): 0

_________________________________________________________

Nodule # 2: 1.3 cm isoechoic solid nodule in the left inferior
gland. This nodule does not meet criteria to warrant biopsy or
further imaging follow-up. Linear echogenic foci within the nodule
likely represent the walls of internal microcystic spaces.

An additional subcentimeter isoechoic nodule in the left superior
gland is also noted incidentally and does not require further
follow-up.
IMPRESSION: Mildly heterogeneous and enlarged thyroid gland.

Incidental note is made of small left-sided thyroid nodules which do
not meet criteria to warrant further evaluation.

The above is in keeping with the ACR TI-RADS recommendations - [HOSPITAL] 6348;[DATE].

## 2022-10-03 ENCOUNTER — Encounter (HOSPITAL_COMMUNITY): Payer: Self-pay | Admitting: *Deleted

## 2022-10-14 ENCOUNTER — Encounter: Payer: Self-pay | Admitting: Cardiology

## 2022-10-23 ENCOUNTER — Telehealth: Payer: Self-pay | Admitting: Interventional Cardiology

## 2022-10-23 NOTE — Telephone Encounter (Signed)
Follow Up:     Patient's wife is calling to schedule patient's  Home Sleep Study. She said he need this asap.

## 2022-10-23 NOTE — Telephone Encounter (Signed)
Sleep study is ready for scheduling at the sleep lab.

## 2022-10-28 NOTE — Telephone Encounter (Signed)
Pt called in to get sleep study scheduled. I gave her the number for the sleep lab so she can call and get it set up but she states it was supposed to be a home sleep study. Please advise

## 2022-10-30 NOTE — Telephone Encounter (Signed)
A Home sleep study was ordered to be picked up at the sleep lab. You can call the sleep lab at [phone] to get scheduled.

## 2022-11-15 ENCOUNTER — Ambulatory Visit (HOSPITAL_BASED_OUTPATIENT_CLINIC_OR_DEPARTMENT_OTHER): Payer: 59 | Attending: Interventional Cardiology | Admitting: Cardiovascular Disease

## 2022-11-15 DIAGNOSIS — G4733 Obstructive sleep apnea (adult) (pediatric): Secondary | ICD-10-CM | POA: Insufficient documentation

## 2022-11-15 DIAGNOSIS — R0683 Snoring: Secondary | ICD-10-CM | POA: Insufficient documentation

## 2022-11-29 ENCOUNTER — Encounter (HOSPITAL_BASED_OUTPATIENT_CLINIC_OR_DEPARTMENT_OTHER): Payer: Self-pay | Admitting: Cardiovascular Disease

## 2022-11-29 NOTE — Procedures (Signed)
     Patient Name: Andrew, [name] Date: 11/17/2022 Gender: Male D.O.B: 1970/10/29 Age (years): 30 Referring Provider: Nicki Guadalajara MD, ABSM Height (inches): 70 Interpreting Physician: Nicki Guadalajara MD, ABSM Weight (lbs): 180 RPSGT: [place] Sink BMI: 26 MRN: [id number] Neck Size: 15.25  CLINICAL INFORMATION Sleep Study Type: HST  Indication for sleep study: Atrial fibrillation, snoring,   Epworth Sleepiness Score: 8  SLEEP STUDY TECHNIQUE A multi-channel overnight portable sleep study was performed. The channels recorded were: nasal airflow, thoracic respiratory movement, and oxygen saturation with a pulse oximetry. Snoring was also monitored.  MEDICATIONS diltiazem (CARDIZEM CD) 180 MG 24 hr capsule diltiazem (CARDIZEM) 30 MG tablet ELIQUIS 5 MG TABS tablet escitalopram (LEXAPRO) 10 MG tablet fluticasone (FLONASE) 50 MCG/ACT nasal spray LORazepam (ATIVAN) 1 MG tablet Multiple Vitamin (MULTIVITAMIN WITH MINERALS) TABS tablet Omega-3 Fatty Acids (FISH OIL PO) rosuvastatin (CRESTOR) 10 MG tablet Turmeric (CURCUMIN 95 PO) vitamin C (ASCORBIC ACID) 500 MG table Patient self administered medications include: N/A.  SLEEP ARCHITECTURE Patient was studied for 408.5 minutes. The sleep efficiency was 100.0 % and the patient was supine for 0%. The arousal index was 0.0 per hour.  RESPIRATORY PARAMETERS The overall AHI was 6.9 per hour, with a central apnea index of 0 per hour. There is a positional component with supine sleep AHI 11.8/h versus non-supine sleep AHI 3.3/h.  The oxygen nadir was 89% during sleep.  CARDIAC DATA Mean heart rate during sleep was 54.6 bpm. The slowest HR was 46 bpm.  IMPRESSIONS - Mild obstructive sleep apnea occurred during this study (AHI  6.9/h). - Minimal oxygen desaturation to a nadir of 89%. - Patient snored for 60.8 minutes (14.9%) during the sleep.  DIAGNOSIS - Obstructive Sleep Apnea (G47.33)  RECOMMENDATIONS -  Therapeutic CPAP titration to determine optimal pressure required to alleviate sleep disordered breathing. Can initiate a trial of CPAP Auto with EPR of 3 at 6 - 16 cm of water.  If patient is against CPAP initiation with mild sleep apnea can consider alternative therapy with a customized oral appliance. - Patient should be counseled to avoid supine sleep; consider positional therapy avoiding supine position during sleep. - Effort should be made to optimize nasal and oropharyngeal patency. - Avoid alcohol, sedatives and other CNS depressants that may worsen sleep apnea and disrupt normal sleep architecture. - Sleep hygiene should be reviewed to assess factors that may improve sleep quality. - Weight management and regular exercise should be initiated or continued. - Return to Sleep Center to discuss the results of this study   [Electronically signed] 11/29/2022 10:06 AM  Nicki Guadalajara MD, [facility], ABSM Diplomate, American Board of Sleep Medicine  NPI: [id number]  [facility] PH: [phone]   FX: [phone] ACCREDITED BY THE AMERICAN ACADEMY OF SLEEP MEDICINE

## 2022-12-02 ENCOUNTER — Telehealth: Payer: Self-pay | Admitting: Cardiology

## 2022-12-02 NOTE — Telephone Encounter (Signed)
STAT if patient feels like he/she is going to faint   Are you dizzy now?  Wife is not currently with the patient  Do you feel faint or have you passed out?  No   Do you have any other symptoms?  SOB when going up and down stairs  Have you checked your HR and BP (record if available)?  No

## 2022-12-02 NOTE — Telephone Encounter (Signed)
Spoke to wife. Pt is experiencing dizziness and SOB when exerting himself going upstairs at home, occurs daily. However this has been going on since before December of last year. He is not experiencing this when working out or working in the yard. It is situational. She just wanted to let us know at this time. They are going to starting checking BP & HR at home when occurs to see if anything is caught on monitor. They are going to call if wants to readdress prior to appt w/ Dr. Izora Ribas 5/17. Wife aware I will help in anyway I can if needed before then.  She is agreeable to plan.

## 2022-12-03 ENCOUNTER — Telehealth: Payer: Self-pay | Admitting: *Deleted

## 2022-12-03 NOTE — Telephone Encounter (Signed)
-----   Message from Lennette Bihari, MD sent at 11/29/2022 10:14 AM EDT ----- Burna Mortimer, please notify patient of the results.  Mild sleep apnea with positional component with supine sleep.  Can consider CPAP auto therapy or possible customized oral appliance.  Patient should be advised to avoid supine sleep.

## 2022-12-03 NOTE — Telephone Encounter (Signed)
Left message to return a call to discuss sleep study results. 

## 2023-01-01 ENCOUNTER — Other Ambulatory Visit: Payer: Self-pay | Admitting: Cardiology

## 2023-01-03 ENCOUNTER — Telehealth (HOSPITAL_BASED_OUTPATIENT_CLINIC_OR_DEPARTMENT_OTHER): Payer: Self-pay

## 2023-01-03 NOTE — Telephone Encounter (Signed)
Attempted to call patient to provide results to sleep study test.  No answer or VM received. Letter mailed to patient requesting he call the office to discuss his sleep study results. Jim Like MHA RN CCM

## 2023-01-08 ENCOUNTER — Encounter: Payer: Self-pay | Admitting: Internal Medicine

## 2023-01-10 ENCOUNTER — Encounter: Payer: Self-pay | Admitting: Internal Medicine

## 2023-01-10 ENCOUNTER — Ambulatory Visit: Payer: 59 | Attending: Cardiology | Admitting: Internal Medicine

## 2023-01-10 VITALS — BP 110/76 | HR 60 | Ht 70.0 in | Wt 171.6 lb

## 2023-01-10 DIAGNOSIS — E785 Hyperlipidemia, unspecified: Secondary | ICD-10-CM

## 2023-01-10 DIAGNOSIS — I2510 Atherosclerotic heart disease of native coronary artery without angina pectoris: Secondary | ICD-10-CM | POA: Diagnosis not present

## 2023-01-10 DIAGNOSIS — G4733 Obstructive sleep apnea (adult) (pediatric): Secondary | ICD-10-CM | POA: Insufficient documentation

## 2023-01-10 DIAGNOSIS — I480 Paroxysmal atrial fibrillation: Secondary | ICD-10-CM | POA: Diagnosis not present

## 2023-01-10 DIAGNOSIS — I2584 Coronary atherosclerosis due to calcified coronary lesion: Secondary | ICD-10-CM | POA: Insufficient documentation

## 2023-01-10 MED ORDER — DILTIAZEM HCL ER COATED BEADS 120 MG PO CP24: 120.0000 mg | ORAL_CAPSULE | Freq: Every day | ORAL | 6 refills | Status: DC

## 2023-01-10 NOTE — Patient Instructions (Signed)
Medication Instructions:  Your physician has recommended you make the following change in your medication:   1) DECREASE diltiazem (Cardizem) to 120mg  daily  *If you need a refill on your cardiac medications before your next appointment, please call your pharmacy*  Lab Work: None ordered today.  Testing/Procedures: None ordered today.  Follow-Up: At [facility], you and your health needs are our priority.  As part of our continuing mission to provide you with exceptional heart care, we have created designated Provider Care Teams.  These Care Teams include your primary Cardiologist (physician) and Advanced Practice Providers (APPs -  Physician Assistants and Nurse Practitioners) who all work together to provide you with the care you need, when you need it.  Your next appointment:   6 month(s)  The format for your next appointment:   In Person  Provider:   Christell Constant, MD {

## 2023-01-10 NOTE — Progress Notes (Signed)
Cardiology Office Note:    Date:  01/10/2023   ID:  Andrew [name], DOB [birth year], MRN [id number]  PCP:  Andrew [name]  Cardiologist:  Andrew Constant, MD   Referring MD: Andrew [facility]*   CC: Transition to new cardiologist  History of Present Illness:    Andrew [name] is a 52 y.o. male with a hx of PAF (lasts up to 12 hours), aortic atherosclerosis, not anticoagulated (CHA2DS2-VASc score of 1), and atrial fibrillation ablation 10/17/2021 and 04/30/2022 ([place]). 2023: Stopped alcohol.  Back at the gym. Evidence of OSA.  Patient notes that he is doing less well than he had hoped. There are no interval hospital/ED visit.    He notes that he has issues with ambulation.  He will get to the top of the stair. No chest pain or pressure. He does note some chest discomfort; his palpitations when she sleeps on the right.  No SOB/DOE. and no PND/Orthopnea. No weight gain or leg swelling.  He still has rare symptomatic AF.  Until Saturday he had only really had them with holidays.  Has cut down alcohol consumption but it is still rare.  He is on a plant based diet. Orthostatic vitals were negative.  Low BP from Andrew [name]. He is getting lipids drawn with his PCP for his physical.     Past Medical History:  Diagnosis Date   Anxiety    Depression    GERD (gastroesophageal reflux disease)     Past Surgical History:  Procedure Laterality Date   ATRIAL FIBRILLATION ABLATION N/A 10/17/2021   Procedure: ATRIAL FIBRILLATION ABLATION;  Surgeon: Andrew Lemming, MD;  Location: [facility] IN[facility];  Service: Cardiovascular;  Laterality: N/A;   ATRIAL FIBRILLATION ABLATION N/A 04/30/2022   Procedure: ATRIAL FIBRILLATION ABLATION;  Surgeon: Andrew Lemming, MD;  Location: [facility] INVASIVE CV LAB;  Service: Cardiovascular;  Laterality: N/A;   CHOLECYSTECTOMY N/A 07/11/2016   Procedure: LAPAROSCOPIC CHOLECYSTECTOMY;  Surgeon: Andrew Miyamoto, MD;   Location: [facility] OR;  Service: General;  Laterality: N/A;   NASAL POLYP EXCISION     VASECTOMY      Current Medications: Current Meds  Medication Sig   Coenzyme Q10 10 MG capsule Take 10 mg by mouth daily.   diltiazem (CARDIZEM CD) 120 MG 24 hr capsule Take 1 capsule (120 mg total) by mouth daily.   diltiazem (CARDIZEM) 30 MG tablet Take 1 tablet every 4 hours AS NEEDED for AFIB heart rate >100   ELIQUIS 5 MG TABS tablet TAKE 1 TABLET BY MOUTH TWICE A DAY   escitalopram (LEXAPRO) 10 MG tablet Take 10 mg by mouth daily.   fluticasone (FLONASE) 50 MCG/ACT nasal spray Place 2 sprays into both nostrils daily.   LORazepam (ATIVAN) 1 MG tablet Take 0.5 mg by mouth 2 (two) times daily as needed for anxiety.   Multiple Vitamin (MULTIVITAMIN WITH MINERALS) TABS tablet Take 1 tablet by mouth daily.   Omega-3 Fatty Acids (FISH OIL PO) Take 1 capsule by mouth every other day.   Turmeric (CURCUMIN 95 PO) Take 2 tablets by mouth daily.   vitamin C (ASCORBIC ACID) 500 MG tablet Take 500 mg by mouth daily.   [DISCONTINUED] diltiazem (CARDIZEM CD) 180 MG 24 hr capsule TAKE 1 CAPSULE BY MOUTH EVERY DAY     Allergies:   Patient has no known allergies.   Social History   Socioeconomic History   Marital status: Married    Spouse name: Not on file  Number of children: Not on file   Years of education: Not on file   Highest education level: Not on file  Occupational History   Not on file  Tobacco Use   Smoking status: Former    Types: Cigarettes    Quit date: 07/11/2006    Years since quitting: 16.5   Smokeless tobacco: Never   Tobacco comments:    Former smoker stopped 10 years ago 05/28/22  Substance and Sexual Activity   Alcohol use: Yes    Alcohol/week: 15.0 standard drinks of alcohol    Types: 15 Standard drinks or equivalent per week    Comment: 2-3 drinks daily 05/28/22   Drug use: No   Sexual activity: Not on file  Other Topics Concern   Not on file  Social History Narrative   Not  on file   Social Determinants of Health   Financial Resource Strain: Not on file  Food Insecurity: Not on file  Transportation Needs: Not on file  Physical Activity: Not on file  Stress: Not on file  Social Connections: Not on file     Family History: The patient's family history includes Fainting in his maternal grandmother.  ROS:   Please see the history of present illness.    Is cut way back on drinking.  All other systems reviewed and are negative.  EKGs/Labs/Other Studies Reviewed:    Cardiac Studies & Procedures       ECHOCARDIOGRAM  ECHOCARDIOGRAM COMPLETE 08/04/2019  Narrative ECHOCARDIOGRAM REPORT    Patient Name:   Andrew [name] Date of Exam: 08/04/2019 Medical Rec #:  [id number]        Height:       70.0 in Accession #:    [id number]       Weight:       171.8 lb Date of Birth:  Sep 25, 1970        BSA:          1.96 m Patient Age:    48 years         BP:           122/86 mmHg Patient Gender: M                HR:           55 bpm. Exam Location:  [address]  Procedure: 2D Echo, 3D Echo, Cardiac Doppler, Color Doppler and Strain Analysis  Indications:   I48.91 Atrial Fibrillation  History:       Patient has no prior history of Echocardiogram examinations. Anxiety.  Sonographer:   Andrew [name] RDCS Referring      [id number] Records Phys:  IMPRESSIONS   1. Left ventricular ejection fraction, by 3D calculation, is 62%. The left ventricle has normal function. There is no left ventricular hypertrophy. 2. The left ventricle has no regional wall motion abnormalities. 3. Global right ventricle has normal systolic function.The right ventricular size is normal. No increase in right ventricular wall thickness. 4. Left atrial size was normal. 5. Right atrial size was normal. 6. The mitral valve is normal in structure. Trivial mitral valve regurgitation. No evidence of mitral stenosis. 7. The tricuspid valve is normal in structure. Tricuspid valve  regurgitation is trivial. 8. The aortic valve is tricuspid. Aortic valve regurgitation is trivial. No evidence of aortic valve sclerosis or stenosis. 9. The pulmonic valve was normal in structure. Pulmonic valve regurgitation is trivial. 10. Normal pulmonary artery systolic pressure. 11. The inferior vena cava is normal  in size with greater than 50% respiratory variability, suggesting right atrial pressure of 3 mmHg. 12. The average left ventricular global longitudinal strain is -19.6 %.  FINDINGS Left Ventricle: Left ventricular ejection fraction is 62%. The left ventricle has normal function. The average left ventricular global longitudinal strain is -19.6 %. The left ventricle has no regional wall motion abnormalities. There is no left ventricular hypertrophy. Left ventricular diastolic parameters were normal. Normal left atrial pressure.  Right Ventricle: The right ventricular size is normal. No increase in right ventricular wall thickness. Global RV systolic function is has normal systolic function. The tricuspid regurgitant velocity is 1.92 m/s, and with an assumed right atrial pressure of 10 mmHg, the estimated right ventricular systolic pressure is normal at 24.7 mmHg.  Left Atrium: Left atrial size was normal in size.  Right Atrium: Right atrial size was normal in size  Pericardium: There is no evidence of pericardial effusion.  Mitral Valve: The mitral valve is normal in structure. Trivial mitral valve regurgitation. No evidence of mitral valve stenosis by observation.  Tricuspid Valve: The tricuspid valve is normal in structure. Tricuspid valve regurgitation is trivial.  Aortic Valve: The aortic valve is tricuspid. Aortic valve regurgitation is trivial. The aortic valve is structurally normal, with no evidence of sclerosis or stenosis.  Pulmonic Valve: The pulmonic valve was normal in structure. Pulmonic valve regurgitation is trivial. Pulmonic regurgitation is trivial.  Aorta:  The aortic root, ascending aorta and aortic arch are all structurally normal, with no evidence of dilitation or obstruction.  Venous: The inferior vena cava is normal in size with greater than 50% respiratory variability, suggesting right atrial pressure of 3 mmHg.  IAS/Shunts: No atrial level shunt detected by color flow Doppler. There is no evidence of a patent foramen ovale. No ventricular septal defect is seen or detected. There is no evidence of an atrial septal defect.   LEFT VENTRICLE PLAX 2D LVIDd:         4.70 cm  Diastology LVIDs:         2.90 cm  LV e' lateral:   10.20 cm/s LV PW:         0.80 cm  LV E/e' lateral: 6.0 LV IVS:        0.80 cm  LV e' medial:    9.48 cm/s LVOT diam:     2.20 cm  LV E/e' medial:  6.5 LV SV:         70 ml LV SV Index:   35.63    2D Longitudinal Strain LVOT Area:     3.80 cm 2D Strain GLS (A2C):   -19.4 % 2D Strain GLS (A3C):   -18.5 % 2D Strain GLS (A4C):   -20.8 % 2D Strain GLS Avg:     -19.6 %  3D Volume EF: 3D EF:        62 % LV EDV:       163 ml LV ESV:       62 ml LV SV:        100 ml  RIGHT VENTRICLE RV Basal diam:  4.50 cm RV S prime:     15.10 cm/s TAPSE (M-mode): 2.6 cm  LEFT ATRIUM             Index       RIGHT ATRIUM           Index LA diam:        4.10 cm 2.10 cm/m  RA Area:  16.80 cm LA Vol (A2C):   43.7 ml 22.33 ml/m RA Volume:   51.70 ml  26.42 ml/m LA Vol (A4C):   37.9 ml 19.37 ml/m LA Biplane Vol: 41.0 ml 20.95 ml/m AORTIC VALVE LVOT Vmax:   113.00 cm/s LVOT Vmean:  79.400 cm/s LVOT VTI:    0.248 m  AORTA Ao Root diam: 3.40 cm Ao Asc diam:  3.60 cm  MITRAL VALVE                        TRICUSPID VALVE MV Area (PHT): 2.62 cm             TR Peak grad:   14.7 mmHg MV PHT:        83.81 msec           TR Vmax:        200.00 cm/s MV Decel Time: 289 msec MV E velocity: 61.70 cm/s 103 cm/s  SHUNTS MV A velocity: 55.30 cm/s 70.3 cm/s Systemic VTI:  0.25 m MV E/A ratio:  1.12       1.5       Systemic Diam:  2.20 cm   Weston Brass MD Electronically signed by Weston Brass MD Signature Date/Time: 08/04/2019/8:44:55 PM    Final    MONITORS  CARDIAC EVENT MONITOR 07/24/2020  Narrative  NSR with HR range 46-141 bpm and avg 61 bpm  Rare brief SVT up to 17 beats.  PAC and PVC burden each < 1%  No atrial fibrillation.           EKG:   01/10/23: Sinus rhythm  Recent Labs: 04/15/2022: BUN 13; Creatinine, Ser 0.93; Hemoglobin 14.8; Platelets 216; Potassium 4.0; Sodium 136  Recent Lipid Panel No results found for: "CHOL", "TRIG", "HDL", "CHOLHDL", "VLDL", "LDLCALC", "LDLDIRECT"  Physical Exam:    VS:  BP 110/76   Pulse 60   Ht 5\' 10"  (1.778 m)   Wt 171 lb 9.6 oz (77.8 kg)   SpO2 95%   BMI 24.62 kg/m     Wt Readings from Last 3 Encounters:  01/10/23 171 lb 9.6 oz (77.8 kg)  08/01/22 174 lb 12.8 oz (79.3 kg)  07/25/22 174 lb (78.9 kg)    GEN: Healthy appearing. No acute distress HEENT: Normal NECK: No JVD. CARDIAC: No murmur. RRR no gallop, or edema. VASCULAR:  Normal Pulses RESPIRATORY:  Clear to auscultation without rales, wheezing or rhonchi  ABDOMEN: Soft, non-tender, non-distended MUSCULOSKELETAL: No deformity  SKIN: Warm and dry NEUROLOGIC:  Alert and oriented x 3 PSYCHIATRIC:  Normal affect   ASSESSMENT:    1. Paroxysmal atrial fibrillation (HCC)   2. OSA (obstructive sleep apnea)   3. Coronary artery calcification   4. Hyperlipidemia LDL goal <70     PLAN:    PAF - s/p ablation; sees EP (see Dr. Elberta Fortis note) - I am concerned with symptomatic hypotension on diltiazem, would wean to 120 mg PO daily and prn for breakthrough sx; after they return from [facility] is sx have not resolved would then stop diltiazem and just take PRN - discussed alcohol and AF - discussed OSA and AF - continue PRN diltiazem 30 mg PO PRN  OSA - has not started CPAP  CAC HLD - Lp(a) - discussed dietary changes, reviewed CT with patient and SO, we have set an LDL goal  < 70 - we have a low threshold to start rosuvastatin  Fall follow up      Medication Adjustments/Labs  and Tests Ordered: Current medicines are reviewed at length with the patient today.  Concerns regarding medicines are outlined above.  Orders Placed This Encounter  Procedures   EKG 12-Lead   Meds ordered this encounter  Medications   diltiazem (CARDIZEM CD) 120 MG 24 hr capsule    Sig: Take 1 capsule (120 mg total) by mouth daily.    Dispense:  30 capsule    Refill:  6    Dose change (decreased)    Patient Instructions  Medication Instructions:  Your physician has recommended you make the following change in your medication:   1) DECREASE diltiazem (Cardizem) to 120mg  daily  *If you need a refill on your cardiac medications before your next appointment, please call your pharmacy*  Lab Work: None ordered today.  Testing/Procedures: None ordered today.  Follow-Up: At [facility], you and your health needs are our priority.  As part of our continuing mission to provide you with exceptional heart care, we have created designated Provider Care Teams.  These Care Teams include your primary Cardiologist (physician) and Advanced Practice Providers (APPs -  Physician Assistants and Nurse Practitioners) who all work together to provide you with the care you need, when you need it.  Your next appointment:   6 month(s)  The format for your next appointment:   In Person  Provider:   Christell Constant, MD {   Signed, Andrew Constant, MD  01/10/2023 5:55 PM    [facility]

## 2023-01-20 ENCOUNTER — Ambulatory Visit
Admission: RE | Admit: 2023-01-20 | Discharge: 2023-01-20 | Disposition: A | Discharge status: Home / Self Care | Payer: Managed Care, Other (non HMO) | Source: Ambulatory Visit | Attending: Internal Medicine | Admitting: Internal Medicine

## 2023-01-20 VITALS — BP 122/83 | HR 62 | Temp 98.4°F | Resp 18 | Ht 70.0 in | Wt 170.0 lb

## 2023-01-20 DIAGNOSIS — B029 Zoster without complications: Secondary | ICD-10-CM | POA: Diagnosis not present

## 2023-01-20 DIAGNOSIS — B019 Varicella without complication: Secondary | ICD-10-CM

## 2023-01-20 HISTORY — DX: Varicella without complication: B01.9

## 2023-01-20 MED ORDER — VALACYCLOVIR HCL 1 G PO TABS: 1000.0000 mg | ORAL_TABLET | Freq: Three times a day (TID) | ORAL | 0 refills | Status: AC

## 2023-01-20 NOTE — ED Triage Notes (Signed)
"  I have a Rash on my back". First noticed "about a week ago as maybe a bug bite". Red area on mid back (upper left). History of Varicella Disease (as a child). No history of Varicella Vaccine. "Very tired recently with leg pain, nothing else with left eye itching on occasion.".  No history of shingles to date.  Note: "Recently changed a BP medication (reduced) just in case that matters".

## 2023-01-20 NOTE — ED Provider Notes (Signed)
[facility]    CSN: [id number] Arrival date & time: 01/20/23  1656      History   Chief Complaint Chief Complaint  Patient presents with   Skin Problem    Shingles? - Entered by patient    HPI Andrew [name] is a 52 y.o. male.   Patient presents with rash to back that has been present since 5/21.  Patient reports it is itchy and "sore".  He denies any changes in the environment.  Denies any associated fever.  Patient mentions left eye irritation and left leg pain to see if this would help differentiate cause of rash.  Although, patient states that he is not necessarily concerned about those symptoms today but is mainly concerned about cause of rash. Denies any known sick contacts.     Past Medical History:  Diagnosis Date   Anxiety    Depression    GERD (gastroesophageal reflux disease)    Varicella    At a young age.    Patient Active Problem List   Diagnosis Date Noted   Varicella 01/20/2023   OSA (obstructive sleep apnea) 01/10/2023   Coronary artery calcification 01/10/2023   Hyperlipidemia LDL goal <70 01/10/2023   Weakness 07/10/2021   Spells of trembling 07/10/2021   Abnormal thyroid blood test 02/12/2021   Thyromegaly 02/12/2021   Other fatigue 02/12/2021   Atrial fibrillation (HCC) 02/23/2020   Abdominal pain 05/21/2013   Nausea 05/21/2013    Past Surgical History:  Procedure Laterality Date   ATRIAL FIBRILLATION ABLATION N/A 10/17/2021   Procedure: ATRIAL FIBRILLATION ABLATION;  Surgeon: Regan Lemming, MD;  [facility]: [facility];  Service: Cardiovascular;  Laterality: N/A;   ATRIAL FIBRILLATION ABLATION N/A 04/30/2022   Procedure: ATRIAL FIBRILLATION ABLATION;  Surgeon: Regan Lemming, MD;  Location: [facility];  Service: Cardiovascular;  Laterality: N/A;   CHOLECYSTECTOMY N/A 07/11/2016   Procedure: LAPAROSCOPIC CHOLECYSTECTOMY;  Surgeon: Abigail Miyamoto, MD;  Location: [facility] OR;  Service: General;  Laterality:  N/A;   NASAL POLYP EXCISION     VASECTOMY         Home Medications    Prior to Admission medications   Medication Sig Start Date End Date Taking? Authorizing Provider  Coenzyme Q10 10 MG capsule Take 10 mg by mouth daily.   Yes [provider]  diltiazem (CARDIZEM CD) 120 MG 24 hr capsule Take 1 capsule (120 mg total) by mouth daily. 01/10/23  Yes Chandrasekhar, Rondel Jumbo, MD  ELIQUIS 5 MG TABS tablet TAKE 1 TABLET BY MOUTH TWICE A DAY 06/11/22  Yes Lyn Records, MD  escitalopram (LEXAPRO) 10 MG tablet Take 10 mg by mouth daily.   Yes [provider]  fluticasone (FLONASE) 50 MCG/ACT nasal spray Place 2 sprays into both nostrils daily.   Yes [provider]  LORazepam (ATIVAN) 1 MG tablet Take 0.5 mg by mouth 2 (two) times daily as needed for anxiety. 05/03/19  Yes [provider]  Omega-3 Fatty Acids (FISH OIL PO) Take 1 capsule by mouth every other day.   Yes [provider]  Turmeric (CURCUMIN 95 PO) Take 2 tablets by mouth daily.   Yes [provider]  valACYclovir (VALTREX) 1000 MG tablet Take 1 tablet (1,000 mg total) by mouth 3 (three) times daily for 7 days. 01/20/23 01/27/23 Yes Mikell Kazlauskas, Acie Fredrickson, FNP  vitamin C (ASCORBIC ACID) 500 MG tablet Take 500 mg by mouth daily.   Yes [provider]  diltiazem (  CARDIZEM) 30 MG tablet Take 1 tablet every 4 hours AS NEEDED for AFIB heart rate >100 02/08/20   Newman Nip, NP  Multiple Vitamin (MULTIVITAMIN WITH MINERALS) TABS tablet Take 1 tablet by mouth daily.    [provider]  rosuvastatin (CRESTOR) 10 MG tablet Take 1 tablet (10 mg total) by mouth daily. Patient not taking: Reported on 01/10/2023 07/25/22   Lyn Records, MD    Family History Family History  Problem Relation Age of Onset   Fainting Maternal Grandmother     Social History Social History   Tobacco Use   Smoking status: Former    Types: Cigarettes    Quit date: 07/11/2006    Years since  quitting: 16.5   Smokeless tobacco: Never   Tobacco comments:    Former smoker stopped 10 years ago 05/28/22  Vaping Use   Vaping Use: Never used  Substance Use Topics   Alcohol use: Yes    Alcohol/week: 15.0 standard drinks of alcohol    Types: 15 Standard drinks or equivalent per week    Comment: 2-3 drinks daily 05/28/22   Drug use: No     Allergies   Patient has no known allergies.   Review of Systems Review of Systems Per HPI  Physical Exam Triage Vital Signs ED Triage Vitals  Enc Vitals Group     BP 01/20/23 1711 122/83     Pulse Rate 01/20/23 1711 62     Resp 01/20/23 1711 18     Temp 01/20/23 1711 98.4 F (36.9 C)     Temp Source 01/20/23 1711 Oral     SpO2 01/20/23 1711 97 %     Weight 01/20/23 1708 170 lb (77.1 kg)     Height 01/20/23 1708 5\' 10"  (1.778 m)     Head Circumference --      Peak Flow --      Pain Score 01/20/23 1707 0     Pain Loc --      Pain Edu? --      Excl. in GC? --    No data found.  Updated Vital Signs BP 122/83 (BP Location: Left Arm)   Pulse 62   Temp 98.4 F (36.9 C) (Oral)   Resp 18   Ht 5\' 10"  (1.778 m)   Wt 170 lb (77.1 kg)   SpO2 97%   BMI 24.39 kg/m   Visual Acuity Right Eye Distance:   Left Eye Distance:   Bilateral Distance:    Right Eye Near:   Left Eye Near:    Bilateral Near:     Physical Exam Constitutional:      General: He is not in acute distress.    Appearance: Normal appearance. He is not toxic-appearing or diaphoretic.  HENT:     Head: Normocephalic and atraumatic.  Eyes:     Extraocular Movements: Extraocular movements intact.     Conjunctiva/sclera: Conjunctivae normal.  Pulmonary:     Effort: Pulmonary effort is normal.  Skin:    Comments: Patient has erythematous slightly raised grouped area of rash that is present to the left thoracic back.  No drainage noted.  Neurological:     General: No focal deficit present.     Mental Status: He is alert and oriented to person, place, and  time. Mental status is at baseline.  Psychiatric:        Mood and Affect: Mood normal.        Behavior: Behavior normal.  Thought Content: Thought content normal.        Judgment: Judgment normal.      UC Treatments / Results  Labs (all labs ordered are listed, but only abnormal results are displayed) Labs Reviewed - No data to display  EKG   Radiology No results found.  Procedures Procedures (including critical care time)  Medications Ordered in UC Medications - No data to display  Initial Impression / Assessment and Plan / UC Course  I have reviewed the triage vital signs and the nursing notes.  Pertinent labs & imaging results that were available during my care of the patient were reviewed by me and considered in my medical decision making (see chart for details).     Differential diagnoses for rash include herpes zoster versus contact dermatitis.  I am most suspicious of herpes zoster given that it appears to follow a single dermatome.  Will treat with Valtrex.  Patient reports that he mentioned the eye discomfort and leg pain to see if it would help with diagnosis of rash but he is not necessarily concerned for evaluation for this as he wants rash evaluated.  Do not think these symptoms are related.Therefore, evaluation for this was deferred.  Patient was advised to follow-up if rash persists or worsens.  Patient verbalized understanding and was agreeable with plan. Final Clinical Impressions(s) / UC Diagnoses   Final diagnoses:  Herpes zoster without complication     Discharge Instructions      I have prescribed an antiviral given suspicion of shingles.  Follow-up if any symptoms persist or worsen.    ED Prescriptions     Medication Sig Dispense Auth. Provider   valACYclovir (VALTREX) 1000 MG tablet Take 1 tablet (1,000 mg total) by mouth 3 (three) times daily for 7 days. 21 tablet [place], Acie Fredrickson, [place]      PDMP not reviewed this encounter.    Gustavus Bryant, [place] 01/20/23 [phone]

## 2023-01-20 NOTE — Discharge Instructions (Signed)
I have prescribed an antiviral given suspicion of shingles.  Follow-up if any symptoms persist or worsen.

## 2023-01-27 ENCOUNTER — Ambulatory Visit: Payer: 59 | Admitting: Cardiology

## 2023-02-03 ENCOUNTER — Encounter: Payer: Self-pay | Admitting: Cardiology

## 2023-02-03 ENCOUNTER — Encounter: Payer: Self-pay | Admitting: Internal Medicine

## 2023-02-03 ENCOUNTER — Ambulatory Visit: Payer: Managed Care, Other (non HMO) | Attending: Cardiology | Admitting: Cardiology

## 2023-02-03 VITALS — BP 118/66 | HR 62 | Ht 70.0 in | Wt 172.0 lb

## 2023-02-03 DIAGNOSIS — I480 Paroxysmal atrial fibrillation: Secondary | ICD-10-CM | POA: Diagnosis not present

## 2023-02-03 DIAGNOSIS — Z79899 Other long term (current) drug therapy: Secondary | ICD-10-CM

## 2023-02-03 DIAGNOSIS — E785 Hyperlipidemia, unspecified: Secondary | ICD-10-CM | POA: Diagnosis not present

## 2023-02-03 NOTE — Progress Notes (Unsigned)
Electrophysiology Office Note   Date:  02/04/2023   ID:  Andrew [name], DOB [birth year], MRN [id number]  PCP:  [facility]  Cardiologist:  Katrinka Blazing Primary Electrophysiologist:  Yuka Lallier [name], MD    Chief Complaint: AF   History of Present Illness: Andrew [name] is a 52 y.o. male who is being seen today for the evaluation of AF at the request of Pa, [facility] Presenting today for electrophysiology evaluation.  He has a history sniffer atrial fibrillation and aortic sclerosis.  He is having intermittent episodes of atrial fibrillation with rapid rates.  He is post ablation 10/17/2021 with repeat ablation 04/30/2022.  Despite this, he is continued to have episodes of atrial fibrillation.  He feels at times dizzy and lightheaded.  He went on a trip recently and noted atrial fibrillation.   Today, denies symptoms of palpitations, chest pain, shortness of breath, orthopnea, PND, lower extremity edema, claudication, dizziness, presyncope, syncope, bleeding, or neurologic sequela. The patient is tolerating medications without difficulties.  His diltiazem was decreased at his last visit.  Since then, he has had more dizziness and lightheadedness.  He is continue to try and work out and has not changed his diet eating a plant-based diet.  He does feel mildly improved on this current diet.   Past Medical History:  Diagnosis Date   Anxiety    Depression    GERD (gastroesophageal reflux disease)    Varicella    At a young age.   Past Surgical History:  Procedure Laterality Date   ATRIAL FIBRILLATION ABLATION N/A 10/17/2021   Procedure: ATRIAL FIBRILLATION ABLATION;  Surgeon: Regan Lemming, MD;  [facility]: [facility];  Service: Cardiovascular;  Laterality: N/A;   ATRIAL FIBRILLATION ABLATION N/A 04/30/2022   Procedure: ATRIAL FIBRILLATION ABLATION;  Surgeon: Regan Lemming, MD;  [facility]: [facility];  Service: Cardiovascular;   Laterality: N/A;   CHOLECYSTECTOMY N/A 07/11/2016   Procedure: LAPAROSCOPIC CHOLECYSTECTOMY;  Surgeon: Abigail Miyamoto, MD;  Location: [facility] OR;  Service: General;  Laterality: N/A;   NASAL POLYP EXCISION     VASECTOMY       Current Outpatient Medications  Medication Sig Dispense Refill   Coenzyme Q10 10 MG capsule Take 10 mg by mouth daily.     diltiazem (CARDIZEM) 30 MG tablet Take 1 tablet every 4 hours AS NEEDED for AFIB heart rate >100 45 tablet 1   ELIQUIS 5 MG TABS tablet TAKE 1 TABLET BY MOUTH TWICE A DAY 60 tablet 11   escitalopram (LEXAPRO) 10 MG tablet Take 10 mg by mouth daily.     fluticasone (FLONASE) 50 MCG/ACT nasal spray Place 2 sprays into both nostrils daily.     LORazepam (ATIVAN) 1 MG tablet Take 0.5 mg by mouth 2 (two) times daily as needed for anxiety.     Multiple Vitamin (MULTIVITAMIN WITH MINERALS) TABS tablet Take 1 tablet by mouth daily.     Omega-3 Fatty Acids (FISH OIL PO) Take 1 capsule by mouth every other day.     rosuvastatin (CRESTOR) 10 MG tablet Take 1 tablet (10 mg total) by mouth daily. 90 tablet 3   Turmeric (CURCUMIN 95 PO) Take 2 tablets by mouth daily.     vitamin C (ASCORBIC ACID) 500 MG tablet Take 500 mg by mouth daily.     No current facility-administered medications for this visit.    Allergies:   Patient has no known allergies.   Social History:  The  patient  reports that he quit smoking about 16 years ago. His smoking use included cigarettes. He has never used smokeless tobacco. He reports current alcohol use of about 15.0 standard drinks of alcohol per week. He reports that he does not use drugs.   Family History:  The patient's family history includes Fainting in his maternal grandmother.   ROS:  Please see the history of present illness.   Otherwise, review of systems is positive for none.   All other systems are reviewed and negative.   PHYSICAL EXAM: VS:  BP 118/66   Pulse 62   Ht 5\' 10"  (1.778 m)   Wt 172 lb (78 kg)   SpO2  96%   BMI 24.68 kg/m  , BMI Body mass index is 24.68 kg/m. GEN: Well nourished, well developed, in no acute distress  HEENT: normal  Neck: no JVD, carotid bruits, or masses Cardiac: RRR; no murmurs, rubs, or gallops,no edema  Respiratory:  clear to auscultation bilaterally, normal work of breathing GI: soft, nontender, nondistended, + BS MS: no deformity or atrophy  Skin: warm and dry Neuro:  Strength and sensation are intact Psych: euthymic mood, full affect  EKG:  EKG is ordered today. Personal review of the ekg ordered shows sinus rhythm   Recent Labs: 04/15/2022: BUN 13; Creatinine, Ser 0.93; Hemoglobin 14.8; Platelets 216; Potassium 4.0; Sodium 136    Lipid Panel  No results found for: "CHOL", "TRIG", "HDL", "CHOLHDL", "VLDL", "LDLCALC", "LDLDIRECT"   Wt Readings from Last 3 Encounters:  02/03/23 172 lb (78 kg)  01/20/23 170 lb (77.1 kg)  01/10/23 171 lb 9.6 oz (77.8 kg)      Other studies Reviewed: Additional studies/ records that were reviewed today include: TTE 07/29/19  Review of the above records today demonstrates:   1. Left ventricular ejection fraction, by 3D calculation, is 62%. The  left ventricle has normal function. There is no left ventricular  hypertrophy.   2. The left ventricle has no regional wall motion abnormalities.   3. Global right ventricle has normal systolic function.The right  ventricular size is normal. No increase in right ventricular wall  thickness.   4. Left atrial size was normal.   5. Right atrial size was normal.   6. The mitral valve is normal in structure. Trivial mitral valve  regurgitation. No evidence of mitral stenosis.   7. The tricuspid valve is normal in structure. Tricuspid valve  regurgitation is trivial.   8. The aortic valve is tricuspid. Aortic valve regurgitation is trivial.  No evidence of aortic valve sclerosis or stenosis.   9. The pulmonic valve was normal in structure. Pulmonic valve  regurgitation is  trivial.  10. Normal pulmonary artery systolic pressure.  11. The inferior vena cava is normal in size with greater than 50%  respiratory variability, suggesting right atrial pressure of 3 mmHg.  12. The average left ventricular global longitudinal strain is -19.6 %.    ASSESSMENT AND PLAN:  1.  Paroxysmal atrial fibrillation: CHA2DS2-VASc of 2.  Currently on Eliquis.  Post ablation 10/17/2021 with repeat ablation 04/30/2022.  He is continue to have short episodes of atrial fibrillation.  He is on diltiazem.  He would prefer to stop the medication if possible.  At this point, we Able Malloy hold diltiazem to see if his symptoms improve.  If not, we Raziya Aveni restart at 120 mg.  2.  Second hypercoagulable state: Currently on Eliquis for atrial fibrillation  3.  Hyperlipidemia: Goal LDL less than 70.  Continue plan per primary cardiology   Current medicines are reviewed at length with the patient today.   The patient does not have concerns regarding his medicines.  The following changes were made today:  none  Labs/ tests ordered today include:  Orders Placed This Encounter  Procedures   Basic metabolic panel   CBC   Lipid Profile     Disposition:   FU 6 months  Signed, Kaylani Fromme [name], MD  02/04/2023 10:15 AM     [facility] [address] [place] [address] [phone] (office) [phone] (fax)

## 2023-02-03 NOTE — Patient Instructions (Addendum)
Medication Instructions:  Your physician has recommended you make the following change in your medication: STOP Diltiazem  *If you need a refill on your cardiac medications before your next appointment, please call your pharmacy*   Lab Work: Eliquis surveillance labs tomorrow: BMET & CBC Lipids tomorrow -- you will need to be fasting.  If you have labs (blood work) drawn today and your tests are completely normal, you will receive your results only by: MyChart Message (if you have MyChart) OR A paper copy in the mail If you have any lab test that is abnormal or we need to change your treatment, we will call you to review the results.   Testing/Procedures: None ordered   Follow-Up: At [facility], you and your health needs are our priority.  As part of our continuing mission to provide you with exceptional heart care, we have created designated Provider Care Teams.  These Care Teams include your primary Cardiologist (physician) and Advanced Practice Providers (APPs -  Physician Assistants and Nurse Practitioners) who all work together to provide you with the care you need, when you need it.   Your next appointment:   3 month(s)  The format for your next appointment:   In Person  Provider:   Loman Brooklyn, MD    Thank you for choosing [facility]!!   Dory Horn, RN [phone]

## 2023-02-04 ENCOUNTER — Ambulatory Visit: Payer: Managed Care, Other (non HMO)

## 2023-02-04 ENCOUNTER — Encounter: Payer: Self-pay | Admitting: Cardiology

## 2023-02-05 ENCOUNTER — Ambulatory Visit: Payer: Managed Care, Other (non HMO) | Attending: Cardiology

## 2023-02-05 DIAGNOSIS — I480 Paroxysmal atrial fibrillation: Secondary | ICD-10-CM

## 2023-02-05 DIAGNOSIS — E785 Hyperlipidemia, unspecified: Secondary | ICD-10-CM

## 2023-02-05 DIAGNOSIS — Z79899 Other long term (current) drug therapy: Secondary | ICD-10-CM

## 2023-02-06 LAB — LIPID PANEL
Chol/HDL Ratio: 3.6 ratio (ref 0.0–5.0)
Cholesterol, Total: 200 mg/dL — ABNORMAL HIGH (ref 100–199)
HDL: 55 mg/dL (ref >39)
LDL Chol Calc (NIH): 126 mg/dL — ABNORMAL HIGH (ref 0–99)
Triglycerides: 107 mg/dL (ref 0–149)
VLDL Cholesterol Cal: 19 mg/dL (ref 5–40)

## 2023-02-06 LAB — BASIC METABOLIC PANEL
BUN/Creatinine Ratio: 13 (ref 9–20)
BUN: 11 mg/dL (ref 6–24)
CO2: 25 mmol/L (ref 20–29)
Calcium: 10 mg/dL (ref 8.7–10.2)
Chloride: 102 mmol/L (ref 96–106)
Creatinine, Ser: 0.82 mg/dL (ref 0.76–1.27)
Glucose: 97 mg/dL (ref 70–99)
Potassium: 4.9 mmol/L (ref 3.5–5.2)
Sodium: 139 mmol/L (ref 134–144)
eGFR: 104 mL/min/{1.73_m2} (ref >59)

## 2023-02-06 LAB — CBC
Hematocrit: 45.5 % (ref 37.5–51.0)
Hemoglobin: 15.8 g/dL (ref 13.0–17.7)
MCH: 33.8 pg — ABNORMAL HIGH (ref 26.6–33.0)
MCHC: 34.7 g/dL (ref 31.5–35.7)
MCV: 97 fL (ref 79–97)
Platelets: 213 10*3/uL (ref 150–450)
RBC: 4.67 x10E6/uL (ref 4.14–5.80)
RDW: 12.7 % (ref 11.6–15.4)
WBC: 6.3 10*3/uL (ref 3.4–10.8)

## 2023-02-17 ENCOUNTER — Ambulatory Visit: Payer: 59 | Admitting: Cardiology

## 2023-03-24 ENCOUNTER — Encounter: Payer: Self-pay | Admitting: *Deleted

## 2023-04-03 IMAGING — CT CT HEART MORPH/PULM VEIN W/ CM & W/O CA SCORE
2 of 7 series · 11 of 20 positions shown, 13 images · IV contrast (Omni 300)
Comparison: None.
COMPARISON: None.

Addendum:
EXAM:
OVER-READ INTERPRETATION  CT CHEST

The following report is an over-read performed by radiologist Dr.
Beslaga Mladen [[email or web address]] on 09/26/2021. This over-read
does not include interpretation of cardiac or coronary anatomy or
pathology. The coronary CTA interpretation by the cardiologist is
attached.
CLINICAL DATA: Atrial fibrillation scheduled for ablation.
Cardiac CTA
TECHNIQUE: A non-contrast, gated CT scan was obtained with axial slices of 3 mm
through the heart for calcium scoring. Calcium scoring was performed
using the Agatston method. A 120 kV retrospective, gated, contrast
cardiac scan was obtained. Gantry rotation speed was 250 msecs and
collimation was 0.6 mm. Nitroglycerin was not given. A delayed scan
was obtained to exclude left atrial appendage thrombus. The 3D
dataset was reconstructed in 5% intervals of the 0-95% of the R-R
cycle. Late systolic phases were analyzed on a dedicated workstation
using MPR, MIP, and VRT modes. The patient received 80 cc of
contrast.

[Series 8: 0-90% · axial · 0.36mm/px · z∈[+1142,+1236]mm · 5 of 2830 slices shown]
[im 472/2830  vessel]
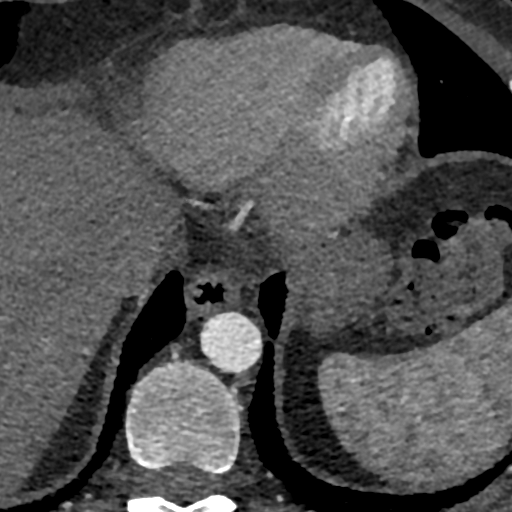
[im 944/2830  vessel]
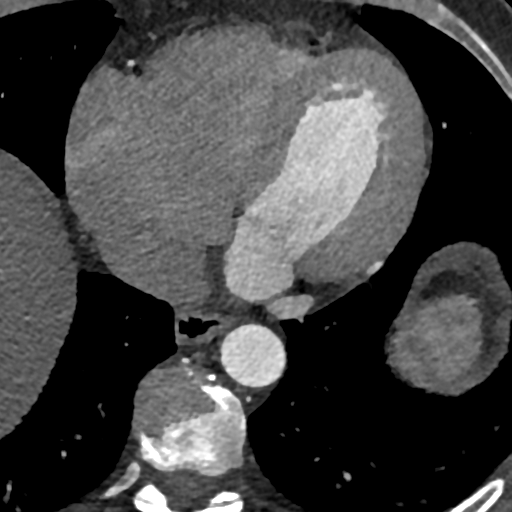
[im 1415/2830  vessel]
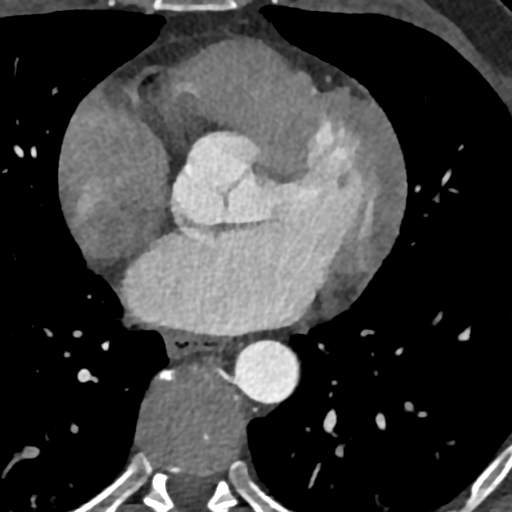
[im 1887/2830  vessel]
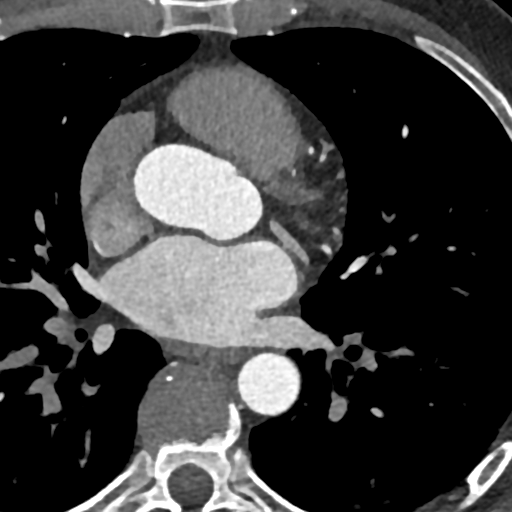
[im 2358/2830  vessel]
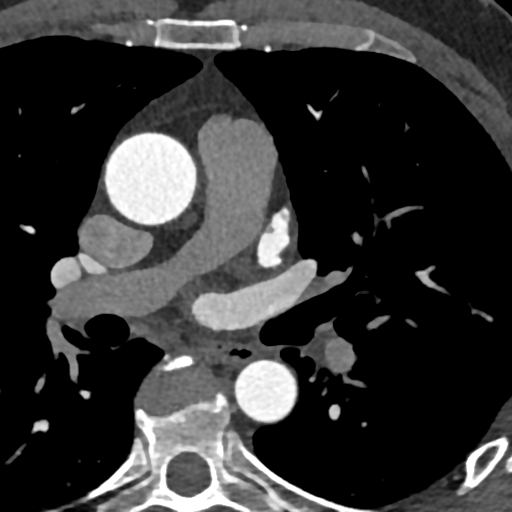

[Series 13: 5-95% · axial · 0.36mm/px · z∈[+1139,+1240]mm · 6 of 2830 slices shown, 8 images]
[im 405/2830  vessel]
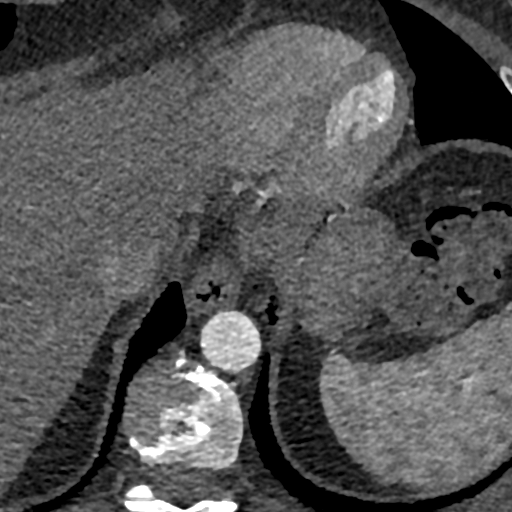
[im 405/2830  lung]
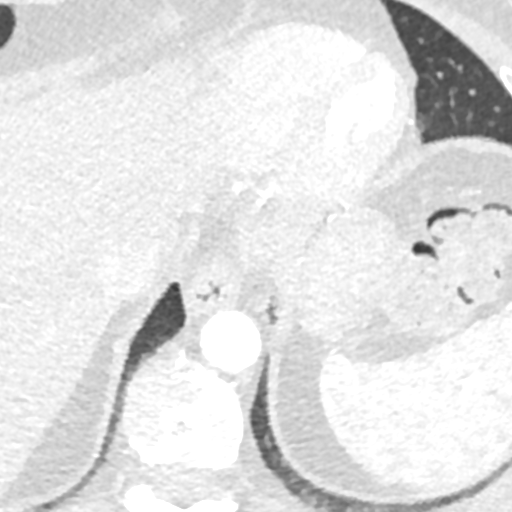
[im 809/2830  vessel]
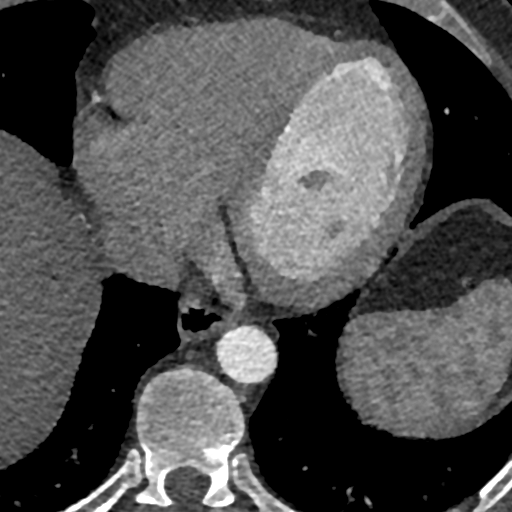
[im 1213/2830  vessel]
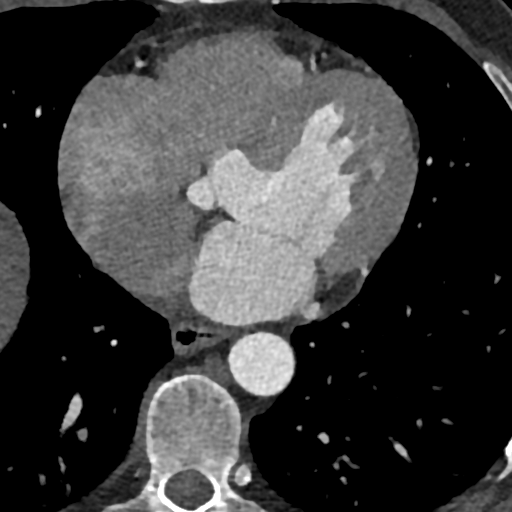
[im 1617/2830  vessel]
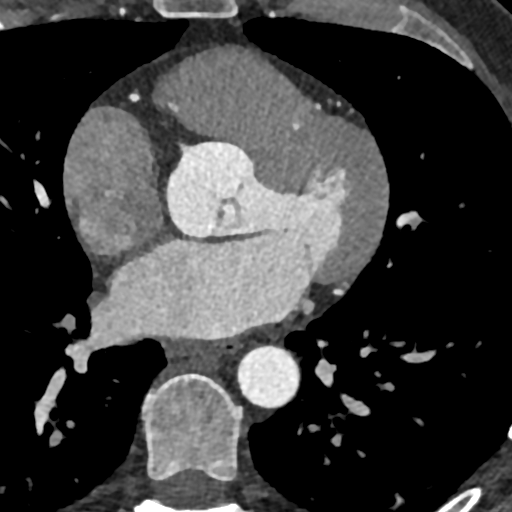
[im 2021/2830  vessel]
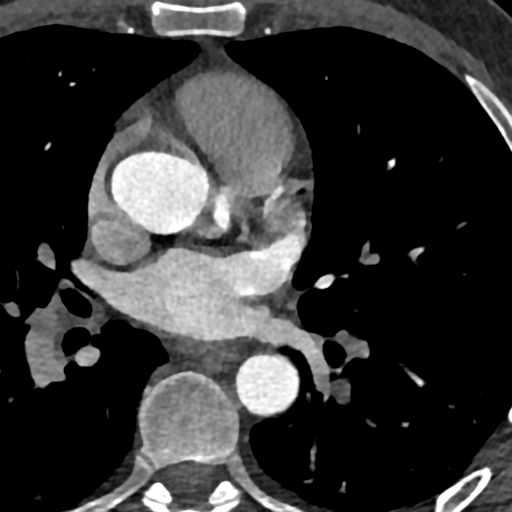
[im 2021/2830  lung]
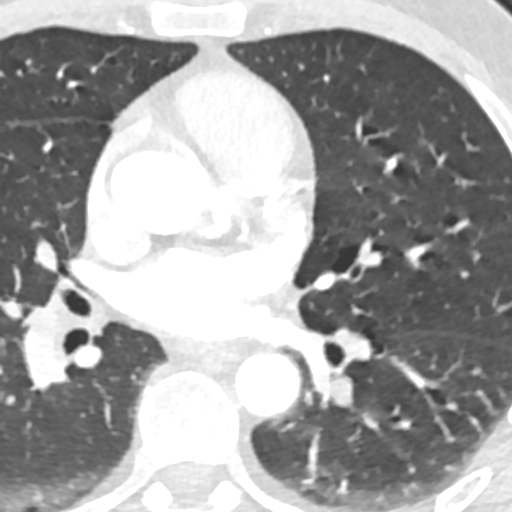
[im 2425/2830  vessel]
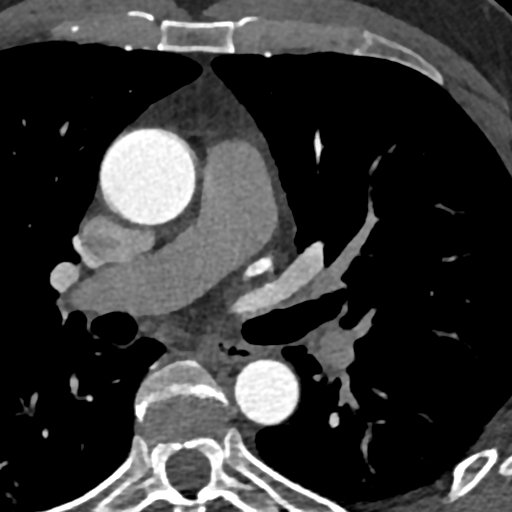

[11 of 20 positions shown; findings below may reference images not displayed]

FINDINGS: Vascular: Heart is normal size.  Aorta normal caliber.

Mediastinum/Nodes: No adenopathy

Lungs/Pleura: No confluent opacities or effusions.

Upper Abdomen: Imaging into the upper abdomen demonstrates no acute
findings.

Musculoskeletal: Chest wall soft tissues are unremarkable. No acute
bony abnormality.
IMPRESSION: No acute or significant extracardiac abnormality.
FINDINGS: Image quality: average.

Pulmonary Veins: There is normal pulmonary vein drainage into the
left atrium (3 on the right and 2 on the left) with ostial
measurements as follows:

RUPV: Ostium 18 x 14 mm  area 1.93 cm^2

RMPV: Ostium 6.6 x 5.7 mm area 0.27 cm^2

RLPV:  Ostium 19 x 17 mm  area 2.54 cm^2

LUPV:  Ostium 18 x 14 mm area 2.02 cm^2

LLPV:  Ostium 17 x 13 mm  area 1.58 cm^2

Left Atrium: The left atrial size is moderately dilated. There is no
PFO/ASD. There is no thrombus in the left atrial appendage on
contrast or delayed imaging. The esophagus runs in the left atrial
midline and is not in proximity to any of the pulmonary vein ostia.

Coronary Arteries: CAC score of 12.3 Agatston units, which is 64th
percentile for age-, race-, and sex-matched controls. Normal
coronary origin. Right dominance. The study was performed without
use of NTG and is insufficient for plaque evaluation.

Right Atrium: Right atrial size is moderately dilated.

Right Ventricle: The right ventricular cavity is within normal
limits.

Left Ventricle: The ventricular cavity size is within normal limits.
There are no stigmata of prior infarction. There is no abnormal
filling defect.

Pericardium: Normal thickness with no significant effusion or
calcium present.

Pulmonary Artery: Normal caliber without proximal filling defect.

Aorta: Normal caliber with no significant disease.

Extra-cardiac findings: See attached radiology report for
non-cardiac structures.
IMPRESSION: 1. There is normal pulmonary vein drainage into the left atrium with
ostial measurements above.

2. There is no thrombus in the left atrial appendage.

3. The esophagus runs in the left atrial midline and is not in
proximity to any of the pulmonary vein ostia.

4. No PFO/ASD.

5. Normal coronary origin. Right dominance.

6. CAC score of 12.3 Agatston units which is 64th percentile for
age-, race-, and sex-matched controls, suggesting intermediate risk
for future cardiac events.

*** End of Addendum ***
EXAM:
OVER-READ INTERPRETATION  CT CHEST

The following report is an over-read performed by radiologist Dr.
Beslaga Mladen [[email or web address]] on 09/26/2021. This over-read
does not include interpretation of cardiac or coronary anatomy or
pathology. The coronary CTA interpretation by the cardiologist is
attached.
FINDINGS: Vascular: Heart is normal size.  Aorta normal caliber.

Mediastinum/Nodes: No adenopathy

Lungs/Pleura: No confluent opacities or effusions.

Upper Abdomen: Imaging into the upper abdomen demonstrates no acute
findings.

Musculoskeletal: Chest wall soft tissues are unremarkable. No acute
bony abnormality.
IMPRESSION: No acute or significant extracardiac abnormality.

## 2023-05-18 ENCOUNTER — Encounter: Payer: Self-pay | Admitting: Cardiology

## 2023-05-19 ENCOUNTER — Encounter: Payer: Self-pay | Admitting: Cardiology

## 2023-05-19 ENCOUNTER — Ambulatory Visit: Payer: Managed Care, Other (non HMO) | Attending: Cardiology | Admitting: Cardiology

## 2023-05-19 VITALS — BP 120/80 | HR 67 | Ht 70.0 in | Wt 167.0 lb

## 2023-05-19 DIAGNOSIS — I483 Typical atrial flutter: Secondary | ICD-10-CM

## 2023-05-19 DIAGNOSIS — I4819 Other persistent atrial fibrillation: Secondary | ICD-10-CM

## 2023-05-19 MED ORDER — PROPAFENONE HCL ER 225 MG PO CP12: 225.0000 mg | ORAL_CAPSULE | Freq: Two times a day (BID) | ORAL | 3 refills | Status: DC

## 2023-05-19 NOTE — Patient Instructions (Addendum)
Medication Instructions:  Your physician has recommended you make the following change in your medication:  START Propafenone (Rythmol) 225 mg twice a day STOP Eliquis  *If you need a refill on your cardiac medications before your next appointment, please call your pharmacy*   Lab Work: None ordered If you have labs (blood work) drawn today and your tests are completely normal, you will receive your results only by: MyChart Message (if you have MyChart) OR A paper copy in the mail If you have any lab test that is abnormal or we need to change your treatment, we will call you to review the results.   Testing/Procedures: None ordered   Follow-Up: At [facility], you and your health needs are our priority.  As part of our continuing mission to provide you with exceptional heart care, we have created designated Provider Care Teams.  These Care Teams include your primary Cardiologist (physician) and Advanced Practice Providers (APPs -  Physician Assistants and Nurse Practitioners) who all work together to provide you with the care you need, when you need it.  Your next appointment:   3 month(s)  The format for your next appointment:   In Person  Provider:   You will see one of the following Advanced Practice Providers on your designated Care Team:   Francis Dowse, [place] "Mardelle Matte" [place], [place] Canary Brim, NP   Thank you for choosing [facility]!!   Dory Horn, RN [phone]  Other Instructions   Propafenone Tablets What is this medication? PROPAFENONE (proe pa FEEN one) prevents and treats a fast or irregular heartbeat (arrhythmia). It is often used to treat a type of arrhythmia known as AFib (atrial fibrillation). It works by slowing down overactive electric signals in the heart, which stabilizes your heart rhythm. It belongs to a group of medications called antiarrhythmics. This medicine may be used for other purposes; ask your health care provider or  pharmacist if you have questions. COMMON BRAND NAME(S): Rythmol What should I tell my care team before I take this medication? They need to know if you have any of these conditions: Heart disease High potassium level Kidney disease Liver disease Low blood pressure Lung or breathing disease like asthma, chronic bronchitis, or emphysema Pacemaker Slow heart rate An unusual or allergic reaction to propafenone, other medications, foods, dyes, or preservatives Pregnant or trying to get pregnant Breast-feeding How should I use this medication? Take this medication by mouth with a glass of water. Follow the directions on the prescription label. You can take this medication with or without food. Take your doses at regular intervals. Do not take your medication more often than directed. Do not stop taking except on the advice of your care team. Talk to your care team regarding the use of this medication in children. Special care may be needed. Overdosage: If you think you have taken too much of this medicine contact a poison control center or emergency room at once. NOTE: This medicine is only for you. Do not share this medicine with others. What if I miss a dose? If you miss a dose, take it as soon as you can. If it is almost time for your next dose, take only that dose. Do not take double or extra doses. What may interact with this medication? Do not take this medication with any of the following: Arsenic trioxide Certain antibiotics like clarithromycin, erythromycin, grepafloxacin, pentamidine, sparfloxacin, troleandomycin Certain medications for depression or mental illness like amoxapine, haloperidol, maprotiline, pimozide, sertindole, thioridazine,  tricyclic antidepressants Certain medications for fungal infections like fluconazole, itraconazole, ketoconazole, posaconazole, voriconazole Certain medications for irregular heartbeat like dronedarone Certain medications for malaria like  chloroquine, halofantrine Cisapride Droperidol Levomethadyl Ranolazine This medication may also interact with the following: Certain medications for angina or blood pressure Certain medications for asthma or breathing difficulties like formoterol, salmeterol Certain medications that treat or prevent blood clots like warfarin Cimetidine Cyclosporine Digoxin Diuretics Local anesthetics Other medications that prolong the QT interval (cause an abnormal heart rhythm) like dofetilide, ziprasidone Rifampin Ritonavir Theophylline This list may not describe all possible interactions. Give your health care provider a list of all the medicines, herbs, non-prescription drugs, or dietary supplements you use. Also tell them if you smoke, drink alcohol, or use illegal drugs. Some items may interact with your medicine. What should I watch for while using this medication? Your condition will be monitored closely when you first begin therapy. Often, this medication is first started in a hospital or other monitored health care setting. Once you are on maintenance therapy, visit your care team for regular checks on your progress. Because your condition and use of this medication carry some risk, it is a good idea to carry an identification card, necklace or bracelet with details of your condition, medications, and care team. You may get drowsy or dizzy. Do not drive, use machinery, or do anything that needs mental alertness until you know how this medication affects you. Do not stand or sit up quickly, especially if you are an older patient. This reduces the risk of dizzy or fainting spells. If you are going to have surgery, tell your care team that you are taking this medication. What side effects may I notice from receiving this medication? Side effects that you should report to your care team as soon as possible: Allergic reactions--skin rash, itching, hives, swelling of the face, lips, tongue, or  throat Heart failure--shortness of breath, swelling of the ankles, feet, or hands, sudden weight gain, unusual weakness or fatigue Heart rhythm changes--fast or irregular heartbeat, dizziness, feeling faint or lightheaded, chest pain, trouble breathing Infection--fever, chills, cough, sore throat Unusual bruising or bleeding Side effects that usually do not require medical attention (report to your care team if they continue or are bothersome): Change in taste Constipation Dizziness Fatigue Nausea This list may not describe all possible side effects. Call your doctor for medical advice about side effects. You may report side effects to FDA at 1-800-FDA-1088. Where should I keep my medication? Keep out of the reach of children and pets. Store at room temperature between 15 and 30 degrees C (59 and 86 degrees F). Protect from light. Keep container tightly closed. Throw away any unused medication after the expiration date. NOTE: This sheet is a summary. It may not cover all possible information. If you have questions about this medicine, talk to your doctor, pharmacist, or health care provider.  2024 Elsevier/Gold Standard (2020-12-20 00:00:00)

## 2023-05-19 NOTE — Progress Notes (Signed)
Electrophysiology Office Note:   Date:  05/19/2023  ID:  Andrew [name], DOB August 10, 1971, MRN [id number]  Primary Cardiologist: Andrew Constant, MD Electrophysiologist: Andrew Lemming, MD      History of Present Illness:   Andrew [name] is a 52 y.o. male with h/o atrial fibrillation and aortic sclerosis seen today for routine electrophysiology followup.   Since last being seen in our clinic the patient reports doing continued episodes of atrial fibrillation.  This is despite ablation x 2, most recently 1 year ago.  He feels significant palpitations and fatigue prior to his episodes.  He states that he feels poorly for a week prior to the episode, and then goes into atrial fibrillation.  He takes diltiazem and lays down and converts to normal rhythm.  Yesterday, he felt poorly, though this was different from his atrial fibrillation.  He was just done working in the yard, and noted palpitations.  He uses Kardia mobile which showed both sinus rhythm and a narrow complex tachycardia, though due to artifact, was difficult to interpret.  he denies chest pain, palpitations, dyspnea, PND, orthopnea, nausea, vomiting, dizziness, syncope, edema, weight gain, or early satiety.   Review of systems complete and found to be negative unless listed in HPI.   EP Information / Studies Reviewed:    EKG is ordered today. Personal review as below.  EKG Interpretation Date/Time:  Monday May 19 2023 12:17:14 EDT Ventricular Rate:  67 PR Interval:  154 QRS Duration:  86 QT Interval:  384 QTC Calculation: 405 R Axis:   54  Text Interpretation: Normal sinus rhythm Normal ECG When compared with ECG of 28-May-2022 11:10, No significant change was found Confirmed by Andrew [name] ([id number]) on 05/19/2023 12:22:36 PM   Risk Assessment/Calculations:    CHA2DS2-VASc Score = 1   This indicates a 0.6% annual risk of stroke. The patient's score is based upon: CHF History: 0 HTN History: 0 Diabetes  History: 0 Stroke History: 0 Vascular Disease History: 1 Age Score: 0 Gender Score: 0    Physical Exam:   VS:  BP 120/80 (BP Location: Left Arm, Patient Position: Sitting, Cuff Size: Normal)   Pulse 67   Ht 5\' 10"  (1.778 m)   Wt 167 lb (75.8 kg)   SpO2 98%   BMI 23.96 kg/m    Wt Readings from Last 3 Encounters:  05/19/23 167 lb (75.8 kg)  02/03/23 172 lb (78 kg)  01/20/23 170 lb (77.1 kg)     GEN: Well nourished, well developed in no acute distress NECK: No JVD; No carotid bruits CARDIAC: Regular rate and rhythm, no murmurs, rubs, gallops RESPIRATORY:  Clear to auscultation without rales, wheezing or rhonchi  ABDOMEN: Soft, non-tender, non-distended EXTREMITIES:  No edema; No deformity   ASSESSMENT AND PLAN:    1.  Paroxysmal atrial fibrillation: Post ablation 05/17/2022 with repeat ablation 04/30/2022.  He is unfortunately continued to have episodes of atrial fibrillation.  Episodes make him feel quite poorly.  He would benefit from further rhythm control.  Andrew [name] start propafenone to 25 mg twice daily.  Due to low CHA2DS2-VASc score, Andrew [name] stop Eliquis today.  2.  Hyperlipidemia: Goal LDL less than 70.  Plan per primary cardiology  Follow up with Dr. [name] in 3 months  Signed, Andrew Mccravy [name], MD

## 2023-05-20 ENCOUNTER — Other Ambulatory Visit: Payer: Self-pay | Admitting: Cardiology

## 2023-08-05 NOTE — Telephone Encounter (Signed)
Called patient, no answer. Will attempt to call back in a few days.

## 2023-08-11 ENCOUNTER — Ambulatory Visit: Payer: Managed Care, Other (non HMO) | Attending: Student | Admitting: Student

## 2023-08-11 ENCOUNTER — Encounter: Payer: Self-pay | Admitting: Student

## 2023-08-11 VITALS — BP 106/74 | HR 67 | Ht 70.0 in | Wt 166.8 lb

## 2023-08-11 DIAGNOSIS — E785 Hyperlipidemia, unspecified: Secondary | ICD-10-CM

## 2023-08-11 DIAGNOSIS — I480 Paroxysmal atrial fibrillation: Secondary | ICD-10-CM

## 2023-08-11 NOTE — Patient Instructions (Signed)
Medication Instructions:  Your physician recommends that you continue on your current medications as directed. Please refer to the Current Medication list given to you today.  *If you need a refill on your cardiac medications before your next appointment, please call your pharmacy*  Lab Work: None ordered If you have labs (blood work) drawn today and your tests are completely normal, you will receive your results only by: MyChart Message (if you have MyChart) OR A paper copy in the mail If you have any lab test that is abnormal or we need to change your treatment, we will call you to review the results.  Follow-Up: At [facility], you and your health needs are our priority.  As part of our continuing mission to provide you with exceptional heart care, we have created designated Provider Care Teams.  These Care Teams include your primary Cardiologist (physician) and Advanced Practice Providers (APPs -  Physician Assistants and Nurse Practitioners) who all work together to provide you with the care you need, when you need it.  Your next appointment:   6 month(s)  Provider:   Loman Brooklyn, MD   Schedule 2-3 month follow up with Dr Izora Ribas

## 2023-08-11 NOTE — Progress Notes (Signed)
  Electrophysiology Office Note:   Date:  08/11/2023  ID:  ARIA HANNAN, DOB Dec 07, 1970, MRN [id number]  Primary Cardiologist: Christell Constant, MD Electrophysiologist: Regan Lemming, MD      History of Present Illness:   Andrew [name] is a 52 y.o. male with h/o AF and Aortic sclerosis seen today for routine electrophysiology followup.   Since last being seen in our clinic the patient reports doing well. Reports no breakthrough AF since starting rhythmol. Currently,  he denies chest pain, palpitations, dyspnea, PND, orthopnea, nausea, vomiting, dizziness, syncope, edema, weight gain, or early satiety.   Review of systems complete and found to be negative unless listed in HPI.   EP Information / Studies Reviewed:    EKG is ordered today. Personal review as below.  EKG Interpretation Date/Time:  Monday August 11 2023 12:14:55 EST Ventricular Rate:  67 PR Interval:  164 QRS Duration:  96 QT Interval:  394 QTC Calculation: 416 R Axis:   41  Text Interpretation: Normal sinus rhythm Normal ECG When compared with ECG of 19-May-2023 12:17, No significant change was found Confirmed by Maxine Glenn [phone]) on 08/11/2023 12:20:46 PM    Arrhythmia History  S/p ablation 09/2021 S/p ablation 04/2022  Physical Exam:   VS:  BP 106/74   Pulse 67   Ht 5\' 10"  (1.778 m)   Wt 166 lb 12.8 oz (75.7 kg)   SpO2 97%   BMI 23.93 kg/m    Wt Readings from Last 3 Encounters:  08/11/23 166 lb 12.8 oz (75.7 kg)  05/19/23 167 lb (75.8 kg)  02/03/23 172 lb (78 kg)     GEN: Well nourished, well developed in no acute distress NECK: No JVD; No carotid bruits CARDIAC: Regular rate and rhythm, no murmurs, rubs, gallops RESPIRATORY:  Clear to auscultation without rales, wheezing or rhonchi  ABDOMEN: Soft, non-tender, non-distended EXTREMITIES:  No edema; No deformity   ASSESSMENT AND PLAN:    PAF S/p ablation 09/2021 and 04/2022 EKG today shows NSR with stable  intervals Cotninue propafenone 25 mg BID CHA2DS2/VASc is only 1, so following for [facility]   HLD Encouraged him to continue to consider statins.  Follow up with Dr. Elberta Fortis in 6 months  Signed, Graciella Freer, PA-C

## 2023-08-14 NOTE — Telephone Encounter (Signed)
Called patient, No answer.

## 2023-10-28 ENCOUNTER — Ambulatory Visit: Payer: Managed Care, Other (non HMO) | Attending: Internal Medicine | Admitting: Internal Medicine

## 2023-10-28 ENCOUNTER — Encounter: Payer: Self-pay | Admitting: Internal Medicine

## 2023-10-28 VITALS — BP 110/78 | HR 61 | Ht 70.0 in | Wt 170.4 lb

## 2023-10-28 DIAGNOSIS — I4819 Other persistent atrial fibrillation: Secondary | ICD-10-CM

## 2023-10-28 DIAGNOSIS — I480 Paroxysmal atrial fibrillation: Secondary | ICD-10-CM

## 2023-10-28 DIAGNOSIS — I2510 Atherosclerotic heart disease of native coronary artery without angina pectoris: Secondary | ICD-10-CM | POA: Diagnosis not present

## 2023-10-28 DIAGNOSIS — E785 Hyperlipidemia, unspecified: Secondary | ICD-10-CM | POA: Diagnosis not present

## 2023-10-28 NOTE — Progress Notes (Signed)
 Cardiology Office Note:    Date:  10/28/2023   ID:  Andrew [name], DOB 12-20-1970, MRN [id number]  PCP:  [facility]  Cardiologist:  Christell Constant, MD   Referring MD: [facility]   CC: Secondary prevention  History of Present Illness:    Andrew [name] is a 53 y.o. male with a hx of PAF (lasts up to 12 hours), aortic atherosclerosis, not anticoagulated (CHA2DS2-VASc score of 1), and atrial fibrillation ablation 10/17/2021 and 04/30/2022 ([facility]). 2023: Stopped alcohol.  Back at the gym. Evidence of OSA. 2024: still cut back a lot.  Rare glass of wine.   Andrew [name] is a 53 year old male with paroxysmal atrial fibrillation and hyperlipidemia who presents for a follow-up visit.  He has a history of paroxysmal atrial fibrillation with prior ablations in 2023. He is currently on propafenone and has maintained rhythm since starting this medication. He experiences significant sleepiness, which he wonders might be related to his medication or other factors. No chest pain, shortness of breath, or palpitations.  He has a history of obstructive sleep apnea, diagnosed via a home sleep study, but no treatment has been initiated. He reports persistent sleepiness, which he attributes to either the medication or the sleep apnea. He is unsure about the accuracy of the sleep study results due to the quality of the test.  He has hyperlipidemia with a history of elevated LDL cholesterol levels, despite following a plant-based diet for over a year. His last cholesterol check was approximately six months ago, and he is uncertain of the current levels. He has a family history of high cholesterol, with his mother and both grandparents affected.  He has a history of aortic atherosclerosis and coronary artery calcification. 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  He has reduced his alcohol intake significantly, now consuming nonalcoholic beers and an occasional glass of wine.   Past Medical History:  Diagnosis Date   Anxiety    Depression    GERD (gastroesophageal reflux disease)    Varicella    At a young age.    Past Surgical History:  Procedure Laterality Date   ATRIAL FIBRILLATION ABLATION N/A 10/17/2021   Procedure: ATRIAL FIBRILLATION ABLATION;  Surgeon: Regan Lemming, MD;  Location: [facility];  Service: Cardiovascular;  Laterality: N/A;   ATRIAL FIBRILLATION ABLATION N/A 04/30/2022   Procedure: ATRIAL FIBRILLATION ABLATION;  Surgeon: Regan Lemming, MD;  Location: [facility];  Service: Cardiovascular;  Laterality: N/A;   CHOLECYSTECTOMY N/A 07/11/2016   Procedure: LAPAROSCOPIC CHOLECYSTECTOMY;  Surgeon: Abigail Miyamoto, MD;  Location: [facility] OR;  Service: General;  Laterality: N/A;   NASAL POLYP EXCISION     VASECTOMY      Current Medications: Current Meds  Medication Sig   Coenzyme Q10 10 MG capsule Take 10 mg by mouth daily.   diltiazem (CARDIZEM) 30 MG tablet Take 1 tablet every 4 hours AS NEEDED for AFIB heart rate >100   escitalopram (LEXAPRO) 10 MG tablet Take 10 mg by mouth daily.   fluticasone (FLONASE) 50 MCG/ACT nasal spray Place 2 sprays into both nostrils daily.   LORazepam (ATIVAN) 1 MG tablet Take 0.5 mg by mouth 2 (two) times daily as needed for anxiety.   Multiple Vitamin (MULTIVITAMIN WITH MINERALS) TABS tablet Take 1  tablet by mouth daily.   Omega-3 Fatty Acids (FISH OIL PO) Take 1 capsule by mouth every other day.   propafenone (RYTHMOL SR) 225 MG 12 hr capsule TAKE 1 CAPSULE BY MOUTH 2 TIMES DAILY.   Turmeric (CURCUMIN 95 PO) Take 2 tablets by mouth daily.   vitamin C (ASCORBIC ACID) 500 MG tablet Take 500 mg by mouth daily.     Allergies:   Patient has no known allergies.   Social History   Socioeconomic History    Marital status: Married    Spouse name: Not on file   Number of children: Not on file   Years of education: Not on file   Highest education level: Not on file  Occupational History   Not on file  Tobacco Use   Smoking status: Former    Current packs/day: 0.00    Types: Cigarettes    Quit date: 07/11/2006    Years since quitting: 17.3   Smokeless tobacco: Never   Tobacco comments:    Former smoker stopped 10 years ago 05/28/22  Vaping Use   Vaping status: Never Used  Substance and Sexual Activity   Alcohol use: Yes    Alcohol/week: 15.0 standard drinks of alcohol    Types: 15 Standard drinks or equivalent per week    Comment: 2-3 drinks daily 05/28/22   Drug use: No   Sexual activity: Not Currently  Other Topics Concern   Not on file  Social History Narrative   Not on file   Social Drivers of Health   Financial Resource Strain: Not on file  Food Insecurity: Not on file  Transportation Needs: Not on file  Physical Activity: Not on file  Stress: Not on file  Social Connections: Not on file     Family History: The patient's family history includes Fainting in his maternal grandmother.  ROS:   Please see the history of present illness.     EKGs/Labs/Other Studies Reviewed:    Cardiac Studies & Procedures   ______________________________________________________________________________________________     ECHOCARDIOGRAM  ECHOCARDIOGRAM COMPLETE 08/04/2019  Narrative ECHOCARDIOGRAM REPORT    Patient Name:   MORGON PAMER Date of Exam: 08/04/2019 Medical Rec #:  [id number]        Height:       70.0 in Accession #:    [id number]       Weight:       171.8 lb Date of Birth:  1971/06/15        BSA:          1.96 m Patient Age:    48 years         BP:           122/86 mmHg Patient Gender: M                HR:           55 bpm. Exam Location:  [address]  Procedure: 2D Echo, 3D Echo, Cardiac Doppler, Color Doppler and Strain Analysis  Indications:   I48.91  Atrial Fibrillation  History:       Patient has no prior history of Echocardiogram examinations. Anxiety.  Sonographer:   Sedonia Small Rodgers-Jones RDCS Referring      [id number] Records Phys:  IMPRESSIONS   1. Left ventricular ejection fraction, by 3D calculation, is 62%. The left ventricle has normal function. There is no left ventricular hypertrophy. 2. The left ventricle has no regional wall motion abnormalities. 3. Global right ventricle has normal  systolic function.The right ventricular size is normal. No increase in right ventricular wall thickness. 4. Left atrial size was normal. 5. Right atrial size was normal. 6. The mitral valve is normal in structure. Trivial mitral valve regurgitation. No evidence of mitral stenosis. 7. The tricuspid valve is normal in structure. Tricuspid valve regurgitation is trivial. 8. The aortic valve is tricuspid. Aortic valve regurgitation is trivial. No evidence of aortic valve sclerosis or stenosis. 9. The pulmonic valve was normal in structure. Pulmonic valve regurgitation is trivial. 10. Normal pulmonary artery systolic pressure. 11. The inferior vena cava is normal in size with greater than 50% respiratory variability, suggesting right atrial pressure of 3 mmHg. 12. The average left ventricular global longitudinal strain is -19.6 %.  FINDINGS Left Ventricle: Left ventricular ejection fraction is 62%. The left ventricle has normal function. The average left ventricular global longitudinal strain is -19.6 %. The left ventricle has no regional wall motion abnormalities. There is no left ventricular hypertrophy. Left ventricular diastolic parameters were normal. Normal left atrial pressure.  Right Ventricle: The right ventricular size is normal. No increase in right ventricular wall thickness. Global RV systolic function is has normal systolic function. The tricuspid regurgitant velocity is 1.92 m/s, and with an assumed right atrial pressure of 10  mmHg, the estimated right ventricular systolic pressure is normal at 24.7 mmHg.  Left Atrium: Left atrial size was normal in size.  Right Atrium: Right atrial size was normal in size  Pericardium: There is no evidence of pericardial effusion.  Mitral Valve: The mitral valve is normal in structure. Trivial mitral valve regurgitation. No evidence of mitral valve stenosis by observation.  Tricuspid Valve: The tricuspid valve is normal in structure. Tricuspid valve regurgitation is trivial.  Aortic Valve: The aortic valve is tricuspid. Aortic valve regurgitation is trivial. The aortic valve is structurally normal, with no evidence of sclerosis or stenosis.  Pulmonic Valve: The pulmonic valve was normal in structure. Pulmonic valve regurgitation is trivial. Pulmonic regurgitation is trivial.  Aorta: The aortic root, ascending aorta and aortic arch are all structurally normal, with no evidence of dilitation or obstruction.  Venous: The inferior vena cava is normal in size with greater than 50% respiratory variability, suggesting right atrial pressure of 3 mmHg.  IAS/Shunts: No atrial level shunt detected by color flow Doppler. There is no evidence of a patent foramen ovale. No ventricular septal defect is seen or detected. There is no evidence of an atrial septal defect.   LEFT VENTRICLE PLAX 2D LVIDd:         4.70 cm  Diastology LVIDs:         2.90 cm  LV e' lateral:   10.20 cm/s LV PW:         0.80 cm  LV E/e' lateral: 6.0 LV IVS:        0.80 cm  LV e' medial:    9.48 cm/s LVOT diam:     2.20 cm  LV E/e' medial:  6.5 LV SV:         70 ml LV SV Index:   35.63    2D Longitudinal Strain LVOT Area:     3.80 cm 2D Strain GLS (A2C):   -19.4 % 2D Strain GLS (A3C):   -18.5 % 2D Strain GLS (A4C):   -20.8 % 2D Strain GLS Avg:     -19.6 %  3D Volume EF: 3D EF:        62 % LV EDV:  163 ml LV ESV:       62 ml LV SV:        100 ml  RIGHT VENTRICLE RV Basal diam:  4.50 cm RV S prime:      15.10 cm/s TAPSE (M-mode): 2.6 cm  LEFT ATRIUM             Index       RIGHT ATRIUM           Index LA diam:        4.10 cm 2.10 cm/m  RA Area:     16.80 cm LA Vol (A2C):   43.7 ml 22.33 ml/m RA Volume:   51.70 ml  26.42 ml/m LA Vol (A4C):   37.9 ml 19.37 ml/m LA Biplane Vol: 41.0 ml 20.95 ml/m AORTIC VALVE LVOT Vmax:   113.00 cm/s LVOT Vmean:  79.400 cm/s LVOT VTI:    0.248 m  AORTA Ao Root diam: 3.40 cm Ao Asc diam:  3.60 cm  MITRAL VALVE                        TRICUSPID VALVE MV Area (PHT): 2.62 cm             TR Peak grad:   14.7 mmHg MV PHT:        83.81 msec           TR Vmax:        200.00 cm/s MV Decel Time: 289 msec MV E velocity: 61.70 cm/s 103 cm/s  SHUNTS MV A velocity: 55.30 cm/s 70.3 cm/s Systemic VTI:  0.25 m MV E/A ratio:  1.12       1.5       Systemic Diam: 2.20 cm   Weston Brass MD Electronically signed by Weston Brass MD Signature Date/Time: 08/04/2019/8:44:55 PM    Final    MONITORS  CARDIAC EVENT MONITOR 07/13/2020  Narrative  NSR with HR range 46-141 bpm and avg 61 bpm  Rare brief SVT up to 17 beats.  PAC and PVC burden each < 1%  No atrial fibrillation.       ______________________________________________________________________________________________      EKG:   01/10/23: Sinus rhythm  Recent Labs: 02/05/2023: BUN 11; Creatinine, Ser 0.82; Hemoglobin 15.8; Platelets 213; Potassium 4.9; Sodium 139  Recent Lipid Panel    Component Value Date/Time   CHOL 200 (H) 02/05/2023 0828   TRIG 107 02/05/2023 0828   HDL 55 02/05/2023 0828   CHOLHDL 3.6 02/05/2023 0828   LDLCALC 126 (H) 02/05/2023 0828    Physical Exam:    VS:  BP 110/78 (BP Location: Right Arm)   Pulse 61   Ht 5\' 10"  (1.778 m)   Wt 170 lb 6.4 oz (77.3 kg)   SpO2 95%   BMI 24.45 kg/m     Wt Readings from Last 3 Encounters:  10/28/23 170 lb 6.4 oz (77.3 kg)  08/11/23 166 lb 12.8 oz (75.7 kg)  05/19/23 167 lb (75.8 kg)    GEN: Healthy  appearing. No acute distress HEENT: Normal NECK: No JVD. CARDIAC: No murmur. RRR no gallop, or edema. VASCULAR:  Normal Pulses RESPIRATORY:  Clear to auscultation without rales, wheezing or rhonchi  ABDOMEN: Soft, non-tender, non-distended MUSCULOSKELETAL: No deformity  SKIN: Warm and dry NEUROLOGIC:  Alert and oriented x 3 PSYCHIATRIC:  Normal affect   ASSESSMENT:    1. Paroxysmal atrial fibrillation (HCC)   2. Persistent atrial fibrillation (HCC)   3. Hyperlipidemia  LDL goal <70   4. Coronary artery calcification      PLAN:    Paroxysmal Atrial Fibrillation Paroxysmal atrial fibrillation with prior ablations in 2023. Currently in sinus rhythm on propafenone. Reports significant sleepiness, not a common side effect of propafenone. Potential link between obstructive sleep apnea and atrial fibrillation. Pulse field ablation, available since fall 2024, considered if atrial fibrillation recurs despite medication. Avoidance of amiodarone due to long-term side effects is preferred. Dofetilide is an alternative but requires a 2.5-day hospital stay. - Continue propafenone as prescribed - Consider pulse field ablation if atrial fibrillation recurs despite medication - Avoid amiodarone due to long-term side effects - Consider dofetilide if needed, with a 2.5-day hospital stay - Send courtesy message to sleep specialist for further evaluation of obstructive sleep apnea  Obstructive Sleep Apnea Evidence of obstructive sleep apnea from a home sleep study, though the accuracy is questioned. Symptoms include excessive daytime sleepiness. Obstructive sleep apnea can contribute to atrial fibrillation. Further evaluation by a sleep specialist is warranted to confirm diagnosis and explore treatment options. - Send courtesy message to sleep specialist for further evaluation - Discuss potential treatment options including CPAP, nasal masks, and mouth guards  Aortic Atherosclerosis Aortic  atherosclerosis with coronary artery calcification. Secondary prevention is a focus due to plaque presence at a relatively young age. Emphasis on risk factor modification through lifestyle changes and potential medication for hyperlipidemia. - Focus on risk factor modification through lifestyle changes and potential medication for hyperlipidemia  Hyperlipidemia LDL cholesterol above goal with evidence of coronary artery calcification and aortic atherosclerosis. Follows a predominantly plant-based diet and is physically active, though not engaging in regular strength training. Family history of hyperlipidemia suggests a genetic component. Prefers to avoid medication, opting for lifestyle modifications first. If lifestyle changes are insufficient, medication may be considered. - Encourage increased cardio and strength training - Recheck lipid panel on February 28, 2024 - Discuss potential initiation of cholesterol medication if lifestyle changes are insufficient     Medication Adjustments/Labs and Tests Ordered: Current medicines are reviewed at length with the patient today.  Concerns regarding medicines are outlined above.  Orders Placed This Encounter  Procedures   Lipid panel   EKG 12-Lead   No orders of the defined types were placed in this encounter.   Patient Instructions  Medication Instructions:  Your physician recommends that you continue on your current medications as directed. Please refer to the Current Medication list given to you today.  *If you need a refill on your cardiac medications before your next appointment, please call your pharmacy*   Lab Work: NONE If you have labs (blood work) drawn today and your tests are completely normal, you will receive your results only by: MyChart Message (if you have MyChart) OR A paper copy in the mail If you have any lab test that is abnormal or we need to change your treatment, we will call you to review the  results.   Testing/Procedures: IN 3 MONTHS- - Fasting lipid panel    Follow-Up: At [facility], you and your health needs are our priority.  As part of our continuing mission to provide you with exceptional heart care, we have created designated Provider Care Teams.  These Care Teams include your primary Cardiologist (physician) and Advanced Practice Providers (APPs -  Physician Assistants and Nurse Practitioners) who all work together to provide you with the care you need, when you need it.    Your next appointment:   1  year(s)  Provider:   Riley Lam, MD       Signed, Christell Constant, MD  10/28/2023 4:02 PM    [facility]

## 2023-10-28 NOTE — Patient Instructions (Addendum)
 Medication Instructions:  Your physician recommends that you continue on your current medications as directed. Please refer to the Current Medication list given to you today.  *If you need a refill on your cardiac medications before your next appointment, please call your pharmacy*   Lab Work: NONE If you have labs (blood work) drawn today and your tests are completely normal, you will receive your results only by: MyChart Message (if you have MyChart) OR A paper copy in the mail If you have any lab test that is abnormal or we need to change your treatment, we will call you to review the results.   Testing/Procedures: IN 3 MONTHS- - Fasting lipid panel    Follow-Up: At [facility], you and your health needs are our priority.  As part of our continuing mission to provide you with exceptional heart care, we have created designated Provider Care Teams.  These Care Teams include your primary Cardiologist (physician) and Advanced Practice Providers (APPs -  Physician Assistants and Nurse Practitioners) who all work together to provide you with the care you need, when you need it.    Your next appointment:   1 year(s)  Provider:   Riley Lam, MD

## 2023-10-29 ENCOUNTER — Encounter: Payer: Self-pay | Admitting: Internal Medicine

## 2023-10-29 DIAGNOSIS — G4733 Obstructive sleep apnea (adult) (pediatric): Secondary | ICD-10-CM

## 2023-12-07 ENCOUNTER — Encounter: Payer: Self-pay | Admitting: Internal Medicine

## 2024-03-31 ENCOUNTER — Other Ambulatory Visit: Payer: Self-pay

## 2024-03-31 ENCOUNTER — Ambulatory Visit
Admission: RE | Admit: 2024-03-31 | Discharge: 2024-03-31 | Disposition: A | Discharge status: Home / Self Care | Source: Ambulatory Visit

## 2024-03-31 VITALS — BP 122/86 | HR 68 | Temp 98.7°F | Resp 16

## 2024-03-31 DIAGNOSIS — K219 Gastro-esophageal reflux disease without esophagitis: Secondary | ICD-10-CM | POA: Insufficient documentation

## 2024-03-31 DIAGNOSIS — R1011 Right upper quadrant pain: Secondary | ICD-10-CM | POA: Insufficient documentation

## 2024-03-31 DIAGNOSIS — K5730 Diverticulosis of large intestine without perforation or abscess without bleeding: Secondary | ICD-10-CM | POA: Insufficient documentation

## 2024-03-31 DIAGNOSIS — R140 Abdominal distension (gaseous): Secondary | ICD-10-CM | POA: Insufficient documentation

## 2024-03-31 DIAGNOSIS — Z1211 Encounter for screening for malignant neoplasm of colon: Secondary | ICD-10-CM | POA: Insufficient documentation

## 2024-03-31 DIAGNOSIS — R079 Chest pain, unspecified: Secondary | ICD-10-CM | POA: Insufficient documentation

## 2024-03-31 DIAGNOSIS — R42 Dizziness and giddiness: Secondary | ICD-10-CM | POA: Diagnosis not present

## 2024-03-31 DIAGNOSIS — K625 Hemorrhage of anus and rectum: Secondary | ICD-10-CM | POA: Insufficient documentation

## 2024-03-31 DIAGNOSIS — R194 Change in bowel habit: Secondary | ICD-10-CM | POA: Insufficient documentation

## 2024-03-31 DIAGNOSIS — Z860100 Personal history of colon polyps, unspecified: Secondary | ICD-10-CM | POA: Insufficient documentation

## 2024-03-31 DIAGNOSIS — R1111 Vomiting without nausea: Secondary | ICD-10-CM | POA: Insufficient documentation

## 2024-03-31 MED ORDER — MECLIZINE HCL 25 MG PO TABS: 25.0000 mg | ORAL_TABLET | Freq: Three times a day (TID) | ORAL | 0 refills | Status: AC | PRN

## 2024-03-31 MED ORDER — ONDANSETRON 4 MG PO TBDP
4.0000 mg | ORAL_TABLET | Freq: Once | ORAL | Status: AC
  Administered 2024-03-31: 4 mg via ORAL

## 2024-03-31 MED ORDER — ONDANSETRON 4 MG PO TBDP: 4.0000 mg | ORAL_TABLET | Freq: Three times a day (TID) | ORAL | 0 refills | Status: AC | PRN

## 2024-03-31 NOTE — ED Triage Notes (Signed)
 Pt c/o vertigo started 8/5. Endorsed vomiting x 4 clear in appearance. Last emesis was about 1800. Unable to hold food down.Sensitivity to light and dizziness x2 days. Bilateral ear ringing appreciated on yesterday. Pt taken dramamine x5 doses last dose 0800 today. [place]. Decrease vomiting episodes today.

## 2024-04-01 NOTE — ED Provider Notes (Signed)
 [facility]    CSN: [id number] Arrival date & time: 03/31/24  1502      History   Chief Complaint No chief complaint on file.   HPI Andrew [name] is a 53 y.o. male.   Patient here today with wife for evaluation of vertigo that started yesterday.  He reports that he has vomited 4 times due to dizziness.  He notes that dizziness is a sensation of the room spinning.  He reports she has had some ear ringing at times but this is not a constant.  He did take Dramamine with mild improvement today.  He has had improvement of vomiting today as well.  Wife does report that significant dizziness and vomiting worsened after patient attempted Epley maneuver at home.  He has not had any chest pain or shortness of breath.  He denies any lightheadedness.  The history is provided by the patient and the spouse.    Past Medical History:  Diagnosis Date   Anxiety    Depression    GERD (gastroesophageal reflux disease)    Varicella    At a young age.    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   Colon cancer screening 03/31/2024   Change in bowel habit 03/31/2024   Abdominal bloating 03/31/2024   Gastroesophageal reflux disease 03/31/2024   History of colonic polyps 03/31/2024   Vomiting without nausea 03/31/2024   Right upper quadrant pain 03/31/2024   Rectal bleeding 03/31/2024   Chest pain 03/31/2024   Varicella 01/20/2023   OSA (obstructive sleep apnea) 01/10/2023   Coronary artery calcification 01/10/2023   Hyperlipidemia LDL goal <70 01/10/2023   Dizzinesses 08/26/2022   Afib (HCC) 10/19/2021   Weakness 07/10/2021   Spells of trembling 07/10/2021   Abnormal thyroid blood test 02/12/2021   Thyromegaly 02/12/2021   Other fatigue 02/12/2021   Atrial fibrillation (HCC) 02/23/2020   Abdominal pain 05/21/2013   Nausea 05/21/2013    Past Surgical History:  Procedure Laterality Date   ATRIAL FIBRILLATION  ABLATION N/A 10/17/2021   Procedure: ATRIAL FIBRILLATION ABLATION;  Surgeon: Inocencio Soyla Lunger, MD;  Location: [facility];  Service: Cardiovascular;  Laterality: N/A;   ATRIAL FIBRILLATION ABLATION N/A 04/30/2022   Procedure: ATRIAL FIBRILLATION ABLATION;  Surgeon: Inocencio Soyla Lunger, MD;  Location: [facility];  Service: Cardiovascular;  Laterality: N/A;   CHOLECYSTECTOMY N/A 07/11/2016   Procedure: LAPAROSCOPIC CHOLECYSTECTOMY;  Surgeon: Vicenta Poli, MD;  Location: [facility];  Service: General;  Laterality: N/A;   NASAL POLYP EXCISION     VASECTOMY         Home Medications    Prior to Admission medications   Medication Sig Start Date End Date Taking? Authorizing Provider  escitalopram (LEXAPRO) 10 MG tablet Take 10 mg by mouth daily.   Yes [provider]  fluticasone (FLONASE) 50 MCG/ACT nasal spray Place 2 sprays into both nostrils daily.   Yes [provider]  LORazepam (ATIVAN) 1 MG tablet Take 0.5 mg by mouth 2 (two) times daily as needed for anxiety. 05/03/19  Yes [provider]  meclizine  (ANTIVERT ) 25 MG tablet Take 1 tablet (25 mg total) by mouth 3 (three) times daily as needed for dizziness. 03/31/24  Yes Billy Asberry FALCON, PA-C  Multiple Vitamin (MULTIVITAMIN WITH MINERALS) TABS tablet Take 1 tablet by mouth daily.   Yes [provider]  Omega-3 Fatty Acids (FISH OIL PO) Take 1 capsule by  mouth every other day.   Yes [provider]  ondansetron  (ZOFRAN -ODT) 4 MG disintegrating tablet Take 1 tablet (4 mg total) by mouth every 8 (eight) hours as needed. 03/31/24  Yes Billy Asberry FALCON, PA-C  propafenone  (RYTHMOL  SR) 225 MG 12 hr capsule TAKE 1 CAPSULE BY MOUTH 2 TIMES DAILY. 05/21/23  Yes Camnitz, Will Gladis, MD  Coenzyme Q10 10 MG capsule Take 10 mg by mouth daily. Patient not taking: Reported on 03/31/2024    [provider]  diltiazem  (CARDIZEM  CD) 120 MG 24 hr capsule Take 120 mg by mouth daily. Patient not  taking: Reported on 03/31/2024 01/06/24   [provider]  diltiazem  (CARDIZEM ) 30 MG tablet Take 1 tablet every 4 hours AS NEEDED for AFIB heart rate >100 02/08/20   Carroll, Donna C, NP  Turmeric (CURCUMIN 95 PO) Take 2 tablets by mouth daily. Patient not taking: Reported on 03/31/2024    [provider]  vitamin C (ASCORBIC ACID) 500 MG tablet Take 500 mg by mouth daily. Patient not taking: Reported on 03/31/2024    [provider]    Family History Family History  Problem Relation Age of Onset   Diabetes Mother    Cancer Father    Fainting Maternal Grandmother     Social History Social History   Tobacco Use   Smoking status: Former    Current packs/day: 0.00    Types: Cigarettes    Quit date: 07/11/2006    Years since quitting: 17.7   Smokeless tobacco: Never   Tobacco comments:    Former smoker stopped 10 years ago 05/28/22  Vaping Use   Vaping status: Never Used  Substance Use Topics   Alcohol use: Yes    Alcohol/week: 15.0 standard drinks of alcohol    Types: 15 Standard drinks or equivalent per week    Comment: 2-3 drinks daily 05/28/22   Drug use: No     Allergies   Patient has no known allergies.   Review of Systems Review of Systems  Constitutional:  Negative for chills and fever.  Eyes:  Negative for discharge and redness.  Respiratory:  Negative for shortness of breath.   Gastrointestinal:  Positive for nausea and vomiting.  Neurological:  Positive for dizziness. Negative for light-headedness, numbness and headaches.     Physical Exam Triage Vital Signs ED Triage Vitals  Encounter Vitals Group     BP 03/31/24 1525 122/86     Girls Systolic BP Percentile --      Girls Diastolic BP Percentile --      Boys Systolic BP Percentile --      Boys Diastolic BP Percentile --      Pulse Rate 03/31/24 1525 68     Resp 03/31/24 1525 16     Temp 03/31/24 1525 98.7 F (37.1 C)     Temp Source 03/31/24 1525 Oral     SpO2 03/31/24 1525  96 %     Weight --      Height --      Head Circumference --      Peak Flow --      Pain Score 03/31/24 1517 0     Pain Loc --      Pain Education --      Exclude from Growth Chart --    No data found.  Updated Vital Signs BP 122/86 (BP Location: Right Arm)   Pulse 68   Temp 98.7 F (37.1 C) (Oral)   Resp 16  SpO2 96%   Visual Acuity Right Eye Distance:   Left Eye Distance:   Bilateral Distance:    Right Eye Near:   Left Eye Near:    Bilateral Near:     Physical Exam Vitals and nursing note reviewed.  Constitutional:      General: He is not in acute distress.    Appearance: Normal appearance. He is not ill-appearing.  HENT:     Head: Normocephalic and atraumatic.  Eyes:     Extraocular Movements: Extraocular movements intact.     Conjunctiva/sclera: Conjunctivae normal.     Pupils: Pupils are equal, round, and reactive to light.     Comments: Nystagmus noted to extremes of EOM to the right  Cardiovascular:     Rate and Rhythm: Normal rate and regular rhythm.  Pulmonary:     Effort: Pulmonary effort is normal. No respiratory distress.     Breath sounds: No wheezing, rhonchi or rales.  Neurological:     Mental Status: He is alert.  Psychiatric:        Mood and Affect: Mood normal.        Behavior: Behavior normal.        Thought Content: Thought content normal.      UC Treatments / Results  Labs (all labs ordered are listed, but only abnormal results are displayed) Labs Reviewed - No data to display  EKG   Radiology No results found.  Procedures Procedures (including critical care time)  Medications Ordered in UC Medications  ondansetron  (ZOFRAN -ODT) disintegrating tablet 4 mg (4 mg Oral Given 03/31/24 1600)    Initial Impression / Assessment and Plan / UC Course  I have reviewed the triage vital signs and the nursing notes.  Pertinent labs & imaging results that were available during my care of the patient were reviewed by me and considered  in my medical decision making (see chart for details).    Symptoms seemingly consistent with BPPV.  Zofran  administered in office and prescribed for nausea.  Advised to maintain hydration and will also treat with meclizine .  Encouraged follow-up if no gradual improvement or with any further concerns.  Advise ED with any worsening.  Final Clinical Impressions(s) / UC Diagnoses   Final diagnoses:  Vertigo   Discharge Instructions   None    ED Prescriptions     Medication Sig Dispense Auth. Provider   ondansetron  (ZOFRAN -ODT) 4 MG disintegrating tablet Take 1 tablet (4 mg total) by mouth every 8 (eight) hours as needed. 20 tablet Billy Stabs F, PA-C   meclizine  (ANTIVERT ) 25 MG tablet Take 1 tablet (25 mg total) by mouth 3 (three) times daily as needed for dizziness. 30 tablet Billy Stabs FALCON, PA-C      PDMP not reviewed this encounter.   Billy Stabs FALCON, PA-C 04/01/24 [id number]

## 2024-09-03 ENCOUNTER — Telehealth: Payer: Self-pay | Admitting: Cardiology

## 2024-09-03 MED ORDER — PROPAFENONE HCL ER 225 MG PO CP12: 225.0000 mg | ORAL_CAPSULE | Freq: Two times a day (BID) | ORAL | 0 refills | Status: AC

## 2024-09-03 NOTE — Telephone Encounter (Signed)
" °*  STAT* If patient is at the pharmacy, call can be transferred to refill team.   1. Which medications need to be refilled? (please list name of each medication and dose if known) propafenone  (RYTHMOL  SR) 225 MG 12 hr capsule   2. Which pharmacy/location (including street and city if local pharmacy) is medication to be sent to?  [id number] - [place], [place] - [address]    3. Do they need a 30 day or 90 day supply? 90  "

## 2024-09-03 NOTE — Telephone Encounter (Signed)
 RX sent in.

## 2024-11-22 ENCOUNTER — Ambulatory Visit: Admitting: Internal Medicine

## 2024-11-29 ENCOUNTER — Ambulatory Visit: Admitting: Cardiology
